# Patient Record
Sex: Male | Born: 1950 | Hispanic: Yes | Marital: Single | Smoking: Former smoker
Health system: Southern US, Community
[De-identification: ages and names within clinical notes are randomized; demographics above are authoritative.]

## PROBLEM LIST (undated history)

## (undated) DIAGNOSIS — J189 Pneumonia, unspecified organism: Secondary | ICD-10-CM

## (undated) DIAGNOSIS — J45909 Unspecified asthma, uncomplicated: Secondary | ICD-10-CM

## (undated) HISTORY — PX: ABOVE KNEE LEG AMPUTATION: SUR20

## (undated) HISTORY — DX: Unspecified asthma, uncomplicated: J45.909

---

## 2019-03-18 ENCOUNTER — Ambulatory Visit: Payer: Self-pay | Attending: Internal Medicine

## 2019-03-18 DIAGNOSIS — Z23 Encounter for immunization: Secondary | ICD-10-CM | POA: Insufficient documentation

## 2019-03-18 NOTE — Progress Notes (Signed)
   Covid-19 Vaccination Clinic  Name:  Jerry Bradley    MRN: [id number] DOB: Oct 31, 1950  03/18/2019  [name] was observed post Covid-19 immunization for 15 minutes without incidence. He was provided with Vaccine Information Sheet and instruction to access the V-Safe system.   [name] was instructed to call 911 with any severe reactions post vaccine: Marland Kitchen Difficulty breathing  . Swelling of your face and throat  . A fast heartbeat  . A bad rash all over your body  . Dizziness and weakness    Immunizations Administered    Name Date Dose VIS Date Route   Pfizer COVID-19 Vaccine 03/18/2019  8:34 AM 0.3 mL 01/06/2019 Intramuscular   Manufacturer: [employer], [employer]   Lot: [id number]   NDC: [address]-1

## 2019-04-11 ENCOUNTER — Ambulatory Visit: Payer: Self-pay | Attending: Internal Medicine

## 2019-04-11 DIAGNOSIS — Z23 Encounter for immunization: Secondary | ICD-10-CM

## 2019-04-11 NOTE — Progress Notes (Signed)
   Covid-19 Vaccination Clinic  Name:  Jestin Burbach    MRN: [id number] DOB: 28-Jul-1950  04/11/2019  Mr. Socorro Ebron was observed post Covid-19 immunization for 15 minutes without incident. He was provided with Vaccine Information Sheet and instruction to access the V-Safe system.   Mr. Krish Bailly was instructed to call 911 with any severe reactions post vaccine: Marland Kitchen Difficulty breathing  . Swelling of face and throat  . A fast heartbeat  . A bad rash all over body  . Dizziness and weakness   Immunizations Administered    Name Date Dose VIS Date Route   Pfizer COVID-19 Vaccine 04/11/2019  9:45 AM 0.3 mL 01/06/2019 Intramuscular   Manufacturer: [employer], [employer]   Lot: [id number]   NDC: [address]-4

## 2020-09-23 ENCOUNTER — Other Ambulatory Visit: Payer: Self-pay

## 2020-09-23 ENCOUNTER — Emergency Department (HOSPITAL_COMMUNITY)
Admission: EM | Admit: 2020-09-23 | Discharge: 2020-09-24 | Disposition: A | Discharge status: Other Acute Inpatient Hospital | Payer: Self-pay | Attending: Emergency Medicine | Admitting: Emergency Medicine

## 2020-09-23 DIAGNOSIS — T24212A Burn of second degree of left thigh, initial encounter: Secondary | ICD-10-CM | POA: Insufficient documentation

## 2020-09-23 DIAGNOSIS — Z20822 Contact with and (suspected) exposure to covid-19: Secondary | ICD-10-CM | POA: Insufficient documentation

## 2020-09-23 DIAGNOSIS — T23121A Burn of first degree of single right finger (nail) except thumb, initial encounter: Secondary | ICD-10-CM | POA: Insufficient documentation

## 2020-09-23 DIAGNOSIS — T300 Burn of unspecified body region, unspecified degree: Secondary | ICD-10-CM

## 2020-09-23 DIAGNOSIS — T23122A Burn of first degree of single left finger (nail) except thumb, initial encounter: Secondary | ICD-10-CM | POA: Insufficient documentation

## 2020-09-23 DIAGNOSIS — Z23 Encounter for immunization: Secondary | ICD-10-CM | POA: Insufficient documentation

## 2020-09-23 DIAGNOSIS — X0821XA Exposure to other furniture fire due to burning cigarette, initial encounter: Secondary | ICD-10-CM | POA: Insufficient documentation
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
## 2020-09-24 ENCOUNTER — Emergency Department (HOSPITAL_COMMUNITY): Payer: Self-pay

## 2020-09-24 ENCOUNTER — Encounter (HOSPITAL_COMMUNITY): Payer: Self-pay | Admitting: Emergency Medicine

## 2020-09-24 LAB — CBC WITH DIFFERENTIAL/PLATELET
Abs Immature Granulocytes: 0.08 10*3/uL — ABNORMAL HIGH (ref 0.00–0.07)
Basophils Absolute: 0.1 10*3/uL (ref 0.0–0.1)
Basophils Relative: 1 %
Eosinophils Absolute: 0.5 10*3/uL (ref 0.0–0.5)
Eosinophils Relative: 7 %
HCT: 39.7 % (ref 39.0–52.0)
Hemoglobin: 13.2 g/dL (ref 13.0–17.0)
Immature Granulocytes: 1 %
Lymphocytes Relative: 14 %
Lymphs Abs: 1.1 10*3/uL (ref 0.7–4.0)
MCH: 30.3 pg (ref 26.0–34.0)
MCHC: 33.2 g/dL (ref 30.0–36.0)
MCV: 91.1 fL (ref 80.0–100.0)
Monocytes Absolute: 0.9 10*3/uL (ref 0.1–1.0)
Monocytes Relative: 12 %
Neutro Abs: 5.1 10*3/uL (ref 1.7–7.7)
Neutrophils Relative %: 65 %
Platelets: 323 10*3/uL (ref 150–400)
RBC: 4.36 MIL/uL (ref 4.22–5.81)
RDW: 12.6 % (ref 11.5–15.5)
WBC: 7.8 10*3/uL (ref 4.0–10.5)
nRBC: 0 % (ref 0.0–0.2)

## 2020-09-24 LAB — POC SARS CORONAVIRUS 2 AG -  ED: SARSCOV2ONAVIRUS 2 AG: NEGATIVE

## 2020-09-24 LAB — BASIC METABOLIC PANEL
Anion gap: 10 (ref 5–15)
BUN: 16 mg/dL (ref 8–23)
CO2: 21 mmol/L — ABNORMAL LOW (ref 22–32)
Calcium: 9.2 mg/dL (ref 8.9–10.3)
Chloride: 105 mmol/L (ref 98–111)
Creatinine, Ser: 1.02 mg/dL (ref 0.61–1.24)
GFR, Estimated: >60 mL/min (ref >60)
Glucose, Bld: 108 mg/dL — ABNORMAL HIGH (ref 70–99)
Potassium: 3.9 mmol/L (ref 3.5–5.1)
Sodium: 136 mmol/L (ref 135–145)

## 2020-09-24 LAB — RESP PANEL BY RT-PCR (FLU A&B, COVID) ARPGX2
Influenza A by PCR: NEGATIVE
Influenza B by PCR: NEGATIVE
SARS Coronavirus 2 by RT PCR: NEGATIVE

## 2020-09-24 MED ORDER — TETANUS-DIPHTH-ACELL PERTUSSIS 5-2.5-18.5 LF-MCG/0.5 IM SUSY
0.5000 mL | PREFILLED_SYRINGE | Freq: Once | INTRAMUSCULAR | Status: AC
  Administered 2020-09-24: 0.5 mL via INTRAMUSCULAR
  Filled 2020-09-24: qty 0.5

## 2020-09-24 MED ORDER — FENTANYL CITRATE PF 50 MCG/ML IJ SOSY
50.0000 ug | PREFILLED_SYRINGE | Freq: Once | INTRAMUSCULAR | Status: AC
  Administered 2020-09-24: 50 ug via INTRAVENOUS
  Filled 2020-09-24: qty 1
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
## 2020-09-24 NOTE — ED Notes (Signed)
Report called to [facility] charge RN for ED

## 2020-09-24 NOTE — ED Notes (Signed)
Fentynaal given to carelink team

## 2020-10-03 ENCOUNTER — Emergency Department (HOSPITAL_COMMUNITY)
Admission: EM | Admit: 2020-10-03 | Discharge: 2020-10-04 | Disposition: A | Discharge status: Home / Self Care | Payer: Self-pay | Attending: Emergency Medicine | Admitting: Emergency Medicine

## 2020-10-03 DIAGNOSIS — T402X1A Poisoning by other opioids, accidental (unintentional), initial encounter: Secondary | ICD-10-CM | POA: Insufficient documentation

## 2020-10-03 DIAGNOSIS — F10120 Alcohol abuse with intoxication, uncomplicated: Secondary | ICD-10-CM | POA: Insufficient documentation

## 2020-10-03 DIAGNOSIS — F10920 Alcohol use, unspecified with intoxication, uncomplicated: Secondary | ICD-10-CM

## 2020-10-03 DIAGNOSIS — T40601A Poisoning by unspecified narcotics, accidental (unintentional), initial encounter: Secondary | ICD-10-CM

## 2020-10-03 DIAGNOSIS — Y906 Blood alcohol level of 120-199 mg/100 ml: Secondary | ICD-10-CM | POA: Insufficient documentation

## 2020-10-03 DIAGNOSIS — F1410 Cocaine abuse, uncomplicated: Secondary | ICD-10-CM | POA: Insufficient documentation
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
## 2020-10-04 LAB — CBC WITH DIFFERENTIAL/PLATELET
Abs Immature Granulocytes: 0.28 10*3/uL — ABNORMAL HIGH (ref 0.00–0.07)
Basophils Absolute: 0.1 10*3/uL (ref 0.0–0.1)
Basophils Relative: 1 %
Eosinophils Absolute: 0.1 10*3/uL (ref 0.0–0.5)
Eosinophils Relative: 1 %
HCT: 42.8 % (ref 39.0–52.0)
Hemoglobin: 13.9 g/dL (ref 13.0–17.0)
Immature Granulocytes: 2 %
Lymphocytes Relative: 6 %
Lymphs Abs: 0.7 10*3/uL (ref 0.7–4.0)
MCH: 29.6 pg (ref 26.0–34.0)
MCHC: 32.5 g/dL (ref 30.0–36.0)
MCV: 91.1 fL (ref 80.0–100.0)
Monocytes Absolute: 0.7 10*3/uL (ref 0.1–1.0)
Monocytes Relative: 6 %
Neutro Abs: 9.9 10*3/uL — ABNORMAL HIGH (ref 1.7–7.7)
Neutrophils Relative %: 84 %
Platelets: 424 10*3/uL — ABNORMAL HIGH (ref 150–400)
RBC: 4.7 MIL/uL (ref 4.22–5.81)
RDW: 12.3 % (ref 11.5–15.5)
WBC: 11.8 10*3/uL — ABNORMAL HIGH (ref 4.0–10.5)
nRBC: 0 % (ref 0.0–0.2)

## 2020-10-04 LAB — COMPREHENSIVE METABOLIC PANEL
ALT: 40 U/L (ref 0–44)
AST: 47 U/L — ABNORMAL HIGH (ref 15–41)
Albumin: 3.5 g/dL (ref 3.5–5.0)
Alkaline Phosphatase: 54 U/L (ref 38–126)
Anion gap: 12 (ref 5–15)
BUN: 13 mg/dL (ref 8–23)
CO2: 18 mmol/L — ABNORMAL LOW (ref 22–32)
Calcium: 8.8 mg/dL — ABNORMAL LOW (ref 8.9–10.3)
Chloride: 109 mmol/L (ref 98–111)
Creatinine, Ser: 1.06 mg/dL (ref 0.61–1.24)
GFR, Estimated: >60 mL/min (ref >60)
Glucose, Bld: 85 mg/dL (ref 70–99)
Potassium: 3.8 mmol/L (ref 3.5–5.1)
Sodium: 139 mmol/L (ref 135–145)
Total Bilirubin: 0.3 mg/dL (ref 0.3–1.2)
Total Protein: 7.5 g/dL (ref 6.5–8.1)

## 2020-10-04 LAB — RAPID URINE DRUG SCREEN, HOSP PERFORMED
Amphetamines: NOT DETECTED
Barbiturates: NOT DETECTED
Benzodiazepines: NOT DETECTED
Cocaine: POSITIVE — AB
Opiates: NOT DETECTED
Tetrahydrocannabinol: NOT DETECTED

## 2020-10-04 LAB — ETHANOL: Alcohol, Ethyl (B): 120 mg/dL — ABNORMAL HIGH (ref <10)

## 2020-12-09 ENCOUNTER — Emergency Department (HOSPITAL_COMMUNITY)
Admission: EM | Admit: 2020-12-09 | Discharge: 2020-12-09 | Disposition: A | Discharge status: Home / Self Care | Payer: Self-pay | Attending: Emergency Medicine | Admitting: Emergency Medicine

## 2020-12-09 ENCOUNTER — Emergency Department (HOSPITAL_COMMUNITY): Payer: Self-pay

## 2020-12-09 ENCOUNTER — Other Ambulatory Visit: Payer: Self-pay

## 2020-12-09 ENCOUNTER — Ambulatory Visit: Payer: Self-pay | Attending: Critical Care Medicine | Admitting: Critical Care Medicine

## 2020-12-09 ENCOUNTER — Encounter: Payer: Self-pay | Admitting: Critical Care Medicine

## 2020-12-09 ENCOUNTER — Other Ambulatory Visit: Payer: Self-pay | Admitting: Critical Care Medicine

## 2020-12-09 VITALS — BP 209/101 | HR 74 | Resp 16 | Wt 140.0 lb

## 2020-12-09 DIAGNOSIS — J441 Chronic obstructive pulmonary disease with (acute) exacerbation: Secondary | ICD-10-CM

## 2020-12-09 DIAGNOSIS — Z79899 Other long term (current) drug therapy: Secondary | ICD-10-CM | POA: Insufficient documentation

## 2020-12-09 DIAGNOSIS — Z87828 Personal history of other (healed) physical injury and trauma: Secondary | ICD-10-CM | POA: Insufficient documentation

## 2020-12-09 DIAGNOSIS — Z20822 Contact with and (suspected) exposure to covid-19: Secondary | ICD-10-CM | POA: Insufficient documentation

## 2020-12-09 DIAGNOSIS — Z139 Encounter for screening, unspecified: Secondary | ICD-10-CM

## 2020-12-09 DIAGNOSIS — Z1211 Encounter for screening for malignant neoplasm of colon: Secondary | ICD-10-CM

## 2020-12-09 DIAGNOSIS — I10 Essential (primary) hypertension: Secondary | ICD-10-CM

## 2020-12-09 DIAGNOSIS — Z87891 Personal history of nicotine dependence: Secondary | ICD-10-CM | POA: Insufficient documentation

## 2020-12-09 DIAGNOSIS — J45909 Unspecified asthma, uncomplicated: Secondary | ICD-10-CM | POA: Insufficient documentation

## 2020-12-09 DIAGNOSIS — Z1159 Encounter for screening for other viral diseases: Secondary | ICD-10-CM

## 2020-12-09 DIAGNOSIS — Z7951 Long term (current) use of inhaled steroids: Secondary | ICD-10-CM | POA: Insufficient documentation

## 2020-12-09 DIAGNOSIS — H9190 Unspecified hearing loss, unspecified ear: Secondary | ICD-10-CM | POA: Insufficient documentation

## 2020-12-09 DIAGNOSIS — R0603 Acute respiratory distress: Secondary | ICD-10-CM

## 2020-12-09 DIAGNOSIS — R071 Chest pain on breathing: Secondary | ICD-10-CM

## 2020-12-09 DIAGNOSIS — Z89612 Acquired absence of left leg above knee: Secondary | ICD-10-CM | POA: Insufficient documentation

## 2020-12-09 HISTORY — DX: Essential (primary) hypertension: I10

## 2020-12-09 LAB — CBC WITH DIFFERENTIAL/PLATELET
Abs Immature Granulocytes: 0.04 10*3/uL (ref 0.00–0.07)
Basophils Absolute: 0.2 10*3/uL — ABNORMAL HIGH (ref 0.0–0.1)
Basophils Relative: 2 %
Eosinophils Absolute: 1 10*3/uL — ABNORMAL HIGH (ref 0.0–0.5)
Eosinophils Relative: 11 %
HCT: 44.1 % (ref 39.0–52.0)
Hemoglobin: 14.4 g/dL (ref 13.0–17.0)
Immature Granulocytes: 1 %
Lymphocytes Relative: 23 %
Lymphs Abs: 1.9 10*3/uL (ref 0.7–4.0)
MCH: 29.2 pg (ref 26.0–34.0)
MCHC: 32.7 g/dL (ref 30.0–36.0)
MCV: 89.5 fL (ref 80.0–100.0)
Monocytes Absolute: 0.8 10*3/uL (ref 0.1–1.0)
Monocytes Relative: 9 %
Neutro Abs: 4.7 10*3/uL (ref 1.7–7.7)
Neutrophils Relative %: 54 %
Platelets: 366 10*3/uL (ref 150–400)
RBC: 4.93 MIL/uL (ref 4.22–5.81)
RDW: 12.3 % (ref 11.5–15.5)
WBC: 8.6 10*3/uL (ref 4.0–10.5)
nRBC: 0 % (ref 0.0–0.2)

## 2020-12-09 LAB — RESP PANEL BY RT-PCR (FLU A&B, COVID) ARPGX2
Influenza A by PCR: NEGATIVE
Influenza B by PCR: NEGATIVE
SARS Coronavirus 2 by RT PCR: NEGATIVE

## 2020-12-09 LAB — BASIC METABOLIC PANEL
Anion gap: 7 (ref 5–15)
BUN: 17 mg/dL (ref 8–23)
CO2: 23 mmol/L (ref 22–32)
Calcium: 8.8 mg/dL — ABNORMAL LOW (ref 8.9–10.3)
Chloride: 106 mmol/L (ref 98–111)
Creatinine, Ser: 0.83 mg/dL (ref 0.61–1.24)
GFR, Estimated: >60 mL/min (ref >60)
Glucose, Bld: 102 mg/dL — ABNORMAL HIGH (ref 70–99)
Potassium: 3.7 mmol/L (ref 3.5–5.1)
Sodium: 136 mmol/L (ref 135–145)

## 2020-12-09 LAB — BRAIN NATRIURETIC PEPTIDE: B Natriuretic Peptide: 15.8 pg/mL (ref 0.0–100.0)

## 2020-12-09 MED ORDER — AMLODIPINE BESYLATE 5 MG PO TABS: 5.0000 mg | ORAL_TABLET | Freq: Every day | ORAL | 2 refills | Status: DC

## 2020-12-09 MED ORDER — METHYLPREDNISOLONE SODIUM SUCC 125 MG IJ SOLR
125.0000 mg | Freq: Once | INTRAMUSCULAR | Status: AC
  Administered 2020-12-09: 125 mg via INTRAVENOUS
  Filled 2020-12-09: qty 2

## 2020-12-09 MED ORDER — PREDNISONE 10 MG PO TABS: 40.0000 mg | ORAL_TABLET | Freq: Every day | ORAL | 0 refills | Status: DC

## 2020-12-09 MED ORDER — ALBUTEROL SULFATE HFA 108 (90 BASE) MCG/ACT IN AERS: 4.0000 | INHALATION_SPRAY | Freq: Once | RESPIRATORY_TRACT | Status: DC

## 2020-12-09 MED ORDER — ALBUTEROL SULFATE HFA 108 (90 BASE) MCG/ACT IN AERS: 2.0000 | INHALATION_SPRAY | RESPIRATORY_TRACT | 0 refills | Status: DC | PRN

## 2020-12-09 MED ORDER — CLONIDINE HCL 0.1 MG PO TABS: 0.1000 mg | ORAL_TABLET | Freq: Once | ORAL | Status: DC

## 2020-12-09 MED ORDER — IPRATROPIUM-ALBUTEROL 0.5-2.5 (3) MG/3ML IN SOLN
3.0000 mL | Freq: Once | RESPIRATORY_TRACT | Status: AC
  Administered 2020-12-09: 3 mL via RESPIRATORY_TRACT
  Filled 2020-12-09: qty 3

## 2020-12-09 NOTE — ED Notes (Signed)
This RN offer pt a Engineer, structural for the discussion of d/c paperwork. Pt declined. E-signature pad unavailable at time of pt discharge. This RN discussed discharge materials with pt and answered all pt questions. Pt stated understanding of discharge material.

## 2020-12-09 NOTE — ED Notes (Signed)
Per Dr. Renaye Rakers, pt OK to eat/drink at this time.
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
## 2020-12-09 NOTE — Assessment & Plan Note (Signed)
Respiratory distress suspect COPD exacerbation not treated prior to this was using over-the-counter Primatene Mist  4 puffs of albuterol given in the office and oxygen applied  Care handed over to emergency medical technicians with EMS

## 2020-12-09 NOTE — ED Triage Notes (Signed)
Pt arrives via EMS for SOB and hypertension. Pt was at the [facility] health and wellness clinic for SOB. They gave 4 puffs of albuterol. States his blood pressures was 200s/100s. COVID-19 Vaccine Information can be found .1mg  clonidine PTA. EMS gave 5 mg albuterol in route. Pt reports SOB is now resolved.

## 2020-12-09 NOTE — ED Provider Notes (Signed)
[facility] EMERGENCY DEPARTMENT Provider Note   CSN: [id number] Arrival date & time: 12/09/20  1734     History Chief Complaint  Patient presents with   Shortness of Breath   Hypertension    Jerry Bradley is a 70 y.o. male with history of chronic hypertension, smoking (quit recently), presenting as a transfer from the wellness clinic with concern for respiratory distress and hypertension.  The patient reports he has felt short of breath the past several days, worse at night when he is in bed.  He uses an albuterol pump every 4-6 hours, 2 puffs as needed.  He does not have a nebulizer machine.  He has not actively smoking.  He went to the wellness clinic for an office appointment today, where he was noted to have very high blood pressure and complaining of shortness of breath.  He was given a dose of clonidine and transferred to the emergency department.  EMS gave the patient 1 rounds of nebulizers in route to the hospital, but did not give any other medications including steroids.  The patient says he feels better since arriving in the hospital.  He denies any chest pain or pressure today.  A Spanish interpreter was used for the entirety of my history and physical exam, [place]  HPI     Past Medical History:  Diagnosis Date   Asthma    HTN (hypertension), malignant 12/09/2020    Patient Active Problem List   Diagnosis Date Noted   Respiratory distress 12/09/2020   HTN (hypertension), malignant 12/09/2020    Past Surgical History:  Procedure Laterality Date   ABOVE KNEE LEG AMPUTATION Right        No family history on file.  Social History   Tobacco Use   Smoking status: Former    Packs/day: 1.00    Years: 45.00    Pack years: 45.00    Types: Cigarettes    Quit date: 11/12/2020    Years since quitting: 0.0   Smokeless tobacco: Never  Substance Use Topics   Alcohol use: Yes    Alcohol/week: 14.0 standard drinks    Types: 14 Cans of  beer per week   Drug use: Not Currently    Types: Cocaine    Comment: one time use , 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    Home Medications Prior to Admission medications   Medication Sig Start Date End Date Taking? Authorizing Provider  albuterol (VENTOLIN HFA) 108 (90 Base) MCG/ACT inhaler Inhale 2 puffs into the lungs every 4 (four) hours as needed for wheezing or shortness of breath. 12/09/20  Yes Wyvonnia Dusky, MD  amLODipine (NORVASC) 10 MG tablet Take 1 tablet (10 mg total) by mouth daily for 30 doses. 12/10/20 01/09/21  Elsie Stain, MD  budesonide-formoterol (SYMBICORT) 160-4.5 MCG/ACT inhaler Inhale 2 puffs into the lungs 2 (two) times daily. 12/10/20 12/10/21  Elsie Stain, MD  predniSONE (DELTASONE) 10 MG tablet Take 4 tablets (40 mg total) by mouth daily with breakfast for 5 days. 12/10/20 12/15/20  Elsie Stain, MD    Allergies    Patient has no known allergies.  Review of Systems   Review of Systems  Constitutional:  Negative for chills and fever.  HENT:  Negative for ear pain and sore throat.   Eyes:  Negative for pain and visual disturbance.  Respiratory:  Positive for shortness of breath. Negative for cough.   Cardiovascular:  Negative for chest pain and palpitations.  Gastrointestinal:  Negative  for abdominal pain and vomiting.  Genitourinary:  Negative for dysuria and hematuria.  Musculoskeletal:  Negative for arthralgias and back pain.  Skin:  Negative for color change and rash.  Neurological:  Negative for syncope and light-headedness.  All other systems reviewed and are negative.  Physical Exam Updated Vital Signs BP 138/76 (BP Location: Right Arm)   Pulse 75   Temp 98.1 F (36.7 C) (Oral)   Resp 19   Ht 5' 8.11" (1.73 m)   Wt 58 kg   SpO2 94%   BMI 19.38 kg/m   Physical Exam Constitutional:      General: He is not in acute distress. HENT:     Head: Normocephalic and atraumatic.  Eyes:     Conjunctiva/sclera: Conjunctivae normal.      Pupils: Pupils are equal, round, and reactive to light.  Cardiovascular:     Rate and Rhythm: Normal rate and regular rhythm.  Pulmonary:     Effort: Pulmonary effort is normal. No respiratory distress.     Breath sounds: Wheezing present.  Abdominal:     General: There is no distension.     Tenderness: There is no abdominal tenderness.  Skin:    General: Skin is warm and dry.  Neurological:     General: No focal deficit present.     Mental Status: He is alert. Mental status is at baseline.  Psychiatric:        Mood and Affect: Mood normal.        Behavior: Behavior normal.    ED Results / Procedures / Treatments   Labs (all labs ordered are listed, but only abnormal results are displayed) Labs Reviewed  BASIC METABOLIC PANEL - Abnormal; Notable for the following components:      Result Value   Glucose, Bld 102 (*)    Calcium 8.8 (*)    All other components within normal limits  CBC WITH DIFFERENTIAL/PLATELET - Abnormal; Notable for the following components:   Eosinophils Absolute 1.0 (*)    Basophils Absolute 0.2 (*)    All other components within normal limits  RESP PANEL BY RT-PCR (FLU A&B, COVID) ARPGX2  BRAIN NATRIURETIC PEPTIDE    EKG None  Radiology DG Chest Port 1 View  Result Date: 12/09/2020 CLINICAL DATA:  Acute shortness of breath. EXAM: PORTABLE CHEST 1 VIEW COMPARISON:  None. FINDINGS: The cardiomediastinal silhouette is unremarkable. Mild peribronchial thickening of uncertain chronicity noted. There is no evidence of focal airspace disease, pulmonary edema, suspicious pulmonary nodule/mass, pleural effusion, or pneumothorax. No acute bony abnormalities are identified. IMPRESSION: Mild peribronchial thickening of uncertain chronicity. No evidence of focal pneumonia. Electronically Signed   By: Harmon Pier M.D.   On: 12/09/2020 18:06    Procedures Procedures   Medications Ordered in ED Medications  ipratropium-albuterol (DUONEB) 0.5-2.5 (3) MG/3ML  nebulizer solution 3 mL (3 mLs Nebulization Given 12/09/20 1807)  methylPREDNISolone sodium succinate (SOLU-MEDROL) 125 mg/2 mL injection 125 mg (125 mg Intravenous Given 12/09/20 1806)    ED Course  I have reviewed the triage vital signs and the nursing notes.  Pertinent labs & imaging results that were available during my care of the patient were reviewed by me and considered in my medical decision making (see chart for details).  COPD exacerbation No hypoxia Wheezing improved with duonebs, IV steroids given Will prescribe prednisone, start on amlodipine for untreated HTN.  BP has been only mildly elevated here.  Doubt ACS, PE, CHF, PNA, sepsis, or PTX clinically Spanish translator used  Final Clinical Impression(s) / ED Diagnoses Final diagnoses:  COPD exacerbation ([place])  Hypertension, unspecified type    Rx / DC Orders ED Discharge Orders          Ordered    predniSONE (DELTASONE) 10 MG tablet  Daily with breakfast,   Status:  Discontinued        12/09/20 1957    albuterol (VENTOLIN HFA) 108 (90 Base) MCG/ACT inhaler  Every 4 hours PRN        12/09/20 1957    amLODipine (NORVASC) 5 MG tablet  Daily,   Status:  Discontinued        12/09/20 2025             Wyvonnia Dusky, MD 12/10/20 1304

## 2020-12-09 NOTE — Assessment & Plan Note (Signed)
Malignant hypertension not well controlled  Failed to respond to 1 dose of 0.1 mg clonidine  Emergency transport to ED

## 2020-12-09 NOTE — Patient Instructions (Signed)
You were given a dose of blood pressure medicine and an inhaler  Your EKG was normal  You are still not breathing well and your blood pressure is too high we have called EMS to take you to the hospital for further evaluations and treatments  Le dieron una dosis de medicamento para la presin arterial y Advertising account executive  Su electrocardiograma fue normal  Todava no est respirando bien y su presin arterial es demasiado alta. Llamamos a EMS para que lo lleve al hospital para realizar ms evaluaciones y tratamientos.

## 2020-12-10 ENCOUNTER — Telehealth: Payer: Self-pay | Admitting: Critical Care Medicine

## 2020-12-10 ENCOUNTER — Other Ambulatory Visit: Payer: Self-pay

## 2020-12-10 MED ORDER — AMLODIPINE BESYLATE 10 MG PO TABS
10.0000 mg | ORAL_TABLET | Freq: Every day | ORAL | 2 refills | Status: DC
  Filled 2020-12-10: qty 30, 30d supply, fill #0
  Filled 2021-01-22: qty 30, 30d supply, fill #1

## 2020-12-10 MED ORDER — ALBUTEROL SULFATE HFA 108 (90 BASE) MCG/ACT IN AERS
INHALATION_SPRAY | RESPIRATORY_TRACT | 0 refills | Status: DC
  Filled 2020-12-10: qty 8.5, 16d supply, fill #0

## 2020-12-10 MED ORDER — BUDESONIDE-FORMOTEROL FUMARATE 160-4.5 MCG/ACT IN AERO
2.0000 | INHALATION_SPRAY | Freq: Two times a day (BID) | RESPIRATORY_TRACT | 12 refills | Status: DC
  Filled 2020-12-10: qty 10.2, 30d supply, fill #0
  Filled 2020-12-30: qty 10.2, 30d supply, fill #1
  Filled 2021-01-22: qty 10.2, 30d supply, fill #2
  Filled 2021-02-13: qty 10.2, 30d supply, fill #0

## 2020-12-10 MED ORDER — PREDNISONE 10 MG PO TABS
40.0000 mg | ORAL_TABLET | Freq: Every day | ORAL | 0 refills | Status: AC
  Filled 2020-12-10: qty 20, 5d supply, fill #0

## 2020-12-10 NOTE — Telephone Encounter (Signed)
Pt sent to ED from clinic yesterday PM.  ED treated and released the patient.  Call pt and tell him I sent rx for his Blood pressure medication that the ED ordered and also the prednisone was sent as well to our pharmacy.  Also I sent a new inhaler for him to use   He needs OV with me on Monday for post ED f/u.

## 2020-12-10 NOTE — Telephone Encounter (Signed)
Called patient with interpreter services. Patient did not answer and a vm was left to call [phone] to schedule post ED f/u

## 2020-12-11 ENCOUNTER — Telehealth: Payer: Self-pay | Admitting: Critical Care Medicine

## 2020-12-11 NOTE — Telephone Encounter (Signed)
Still not able to connect with the patient, cell goes to vm.  Then tried to call son Jillyn Hidden, again goes to VM and left another msg there  Assisted with spanish interp So[place] [phone]

## 2020-12-12 LAB — CBC WITH DIFFERENTIAL/PLATELET
Basophils Absolute: 0.2 10*3/uL (ref 0.0–0.2)
Basos: 2 %
EOS (ABSOLUTE): 0.9 10*3/uL — ABNORMAL HIGH (ref 0.0–0.4)
Eos: 12 %
Hematocrit: 44.4 % (ref 37.5–51.0)
Hemoglobin: 15.4 g/dL (ref 13.0–17.7)
Immature Grans (Abs): 0.1 10*3/uL (ref 0.0–0.1)
Immature Granulocytes: 1 %
Lymphocytes Absolute: 1.4 10*3/uL (ref 0.7–3.1)
Lymphs: 17 %
MCH: 29.3 pg (ref 26.6–33.0)
MCHC: 34.7 g/dL (ref 31.5–35.7)
MCV: 85 fL (ref 79–97)
Monocytes Absolute: 0.7 10*3/uL (ref 0.1–0.9)
Monocytes: 8 %
Neutrophils Absolute: 4.7 10*3/uL (ref 1.4–7.0)
Neutrophils: 60 %
Platelets: 399 10*3/uL (ref 150–450)
RBC: 5.25 x10E6/uL (ref 4.14–5.80)
RDW: 11.7 % (ref 11.6–15.4)
WBC: 7.9 10*3/uL (ref 3.4–10.8)

## 2020-12-12 LAB — COMPREHENSIVE METABOLIC PANEL
ALT: 38 IU/L (ref 0–44)
AST: 40 IU/L (ref 0–40)
Albumin/Globulin Ratio: 1.4 (ref 1.2–2.2)
Albumin: 4.7 g/dL (ref 3.8–4.8)
Alkaline Phosphatase: 70 IU/L (ref 44–121)
BUN/Creatinine Ratio: 20 (ref 10–24)
BUN: 17 mg/dL (ref 8–27)
Bilirubin Total: 0.3 mg/dL (ref 0.0–1.2)
CO2: 21 mmol/L (ref 20–29)
Calcium: 9.4 mg/dL (ref 8.6–10.2)
Chloride: 103 mmol/L (ref 96–106)
Creatinine, Ser: 0.83 mg/dL (ref 0.76–1.27)
Globulin, Total: 3.3 g/dL (ref 1.5–4.5)
Glucose: 81 mg/dL (ref 70–99)
Potassium: 4.4 mmol/L (ref 3.5–5.2)
Sodium: 138 mmol/L (ref 134–144)
Total Protein: 8 g/dL (ref 6.0–8.5)
eGFR: 96 mL/min/{1.73_m2} (ref >59)

## 2020-12-12 LAB — LIPID PANEL
Chol/HDL Ratio: 3.9 ratio (ref 0.0–5.0)
Cholesterol, Total: 176 mg/dL (ref 100–199)
HDL: 45 mg/dL (ref >39)
LDL Chol Calc (NIH): 97 mg/dL (ref 0–99)
Triglycerides: 198 mg/dL — ABNORMAL HIGH (ref 0–149)
VLDL Cholesterol Cal: 34 mg/dL (ref 5–40)

## 2020-12-12 LAB — HCV RT-PCR, QUANT (NON-GRAPH)
HCV log10: 5.025 log10 IU/mL
Hepatitis C Quantitation: 106000 IU/mL

## 2020-12-12 LAB — TROPONIN T: Troponin T (Highly Sensitive): <6 ng/L (ref 0–22)

## 2020-12-12 LAB — HCV AB W REFLEX TO QUANT PCR: HCV Ab: >11 s/co ratio — ABNORMAL HIGH (ref 0.0–0.9)

## 2020-12-12 LAB — HEMOGLOBIN A1C
Est. average glucose Bld gHb Est-mCnc: 114 mg/dL
Hgb A1c MFr Bld: 5.6 % (ref 4.8–5.6)

## 2020-12-30 ENCOUNTER — Other Ambulatory Visit: Payer: Self-pay | Admitting: Critical Care Medicine

## 2020-12-30 ENCOUNTER — Other Ambulatory Visit: Payer: Self-pay

## 2020-12-30 NOTE — Telephone Encounter (Signed)
Requested medications are due for refill today.  yes  Requested medications are on the active medications list.  yes  Last refill. 12/09/2020  Future visit scheduled.   no  Notes to clinic.  Rx was signed by Dr. Renaye Rakers. Pt has 2 prescriptions for this medication.

## 2020-12-31 ENCOUNTER — Other Ambulatory Visit: Payer: Self-pay

## 2020-12-31 MED ORDER — ALBUTEROL SULFATE HFA 108 (90 BASE) MCG/ACT IN AERS
INHALATION_SPRAY | RESPIRATORY_TRACT | 2 refills | Status: DC
  Filled 2020-12-31 – 2021-02-13 (×3): qty 8.5, 16d supply, fill #0

## 2021-01-07 ENCOUNTER — Other Ambulatory Visit: Payer: Self-pay

## 2021-01-22 ENCOUNTER — Other Ambulatory Visit: Payer: Self-pay

## 2021-02-13 ENCOUNTER — Other Ambulatory Visit: Payer: Self-pay

## 2021-03-05 ENCOUNTER — Encounter: Payer: Self-pay | Admitting: Critical Care Medicine

## 2021-03-05 ENCOUNTER — Ambulatory Visit: Payer: Self-pay | Attending: Critical Care Medicine | Admitting: Critical Care Medicine

## 2021-03-05 ENCOUNTER — Other Ambulatory Visit: Payer: Self-pay

## 2021-03-05 VITALS — BP 135/70 | HR 60 | Resp 16 | Wt 149.6 lb

## 2021-03-05 DIAGNOSIS — Z1211 Encounter for screening for malignant neoplasm of colon: Secondary | ICD-10-CM

## 2021-03-05 DIAGNOSIS — I10 Essential (primary) hypertension: Secondary | ICD-10-CM

## 2021-03-05 DIAGNOSIS — Z23 Encounter for immunization: Secondary | ICD-10-CM

## 2021-03-05 DIAGNOSIS — Z139 Encounter for screening, unspecified: Secondary | ICD-10-CM

## 2021-03-05 DIAGNOSIS — Z89611 Acquired absence of right leg above knee: Secondary | ICD-10-CM

## 2021-03-05 DIAGNOSIS — B182 Chronic viral hepatitis C: Secondary | ICD-10-CM

## 2021-03-05 DIAGNOSIS — Z114 Encounter for screening for human immunodeficiency virus [HIV]: Secondary | ICD-10-CM

## 2021-03-05 DIAGNOSIS — J4540 Moderate persistent asthma, uncomplicated: Secondary | ICD-10-CM

## 2021-03-05 MED ORDER — OLOPATADINE HCL 0.1 % OP SOLN
1.0000 [drp] | Freq: Two times a day (BID) | OPHTHALMIC | 12 refills | Status: DC
  Filled 2021-03-05: qty 5, 50d supply, fill #0

## 2021-03-05 MED ORDER — ALBUTEROL SULFATE HFA 108 (90 BASE) MCG/ACT IN AERS
INHALATION_SPRAY | RESPIRATORY_TRACT | 2 refills | Status: DC
  Filled 2021-03-05: qty 8.5, 16d supply, fill #0
  Filled 2021-03-25: qty 8.5, 16d supply, fill #1
  Filled 2021-04-07: qty 8.5, 16d supply, fill #2

## 2021-03-05 MED ORDER — AMLODIPINE BESYLATE 10 MG PO TABS
10.0000 mg | ORAL_TABLET | Freq: Every day | ORAL | 2 refills | Status: DC
  Filled 2021-03-05: qty 30, 30d supply, fill #0
  Filled 2021-04-07: qty 30, 30d supply, fill #1
  Filled 2021-05-12: qty 30, 30d supply, fill #2

## 2021-03-05 MED ORDER — BUDESONIDE-FORMOTEROL FUMARATE 160-4.5 MCG/ACT IN AERO
2.0000 | INHALATION_SPRAY | Freq: Two times a day (BID) | RESPIRATORY_TRACT | 12 refills | Status: DC
Start: 2021-03-05 — End: 2021-04-02
  Filled 2021-03-05 – 2021-03-07 (×2): qty 10.2, 30d supply, fill #0
  Filled 2021-04-01: qty 10.2, 30d supply, fill #1

## 2021-03-05 NOTE — Patient Instructions (Signed)
Labs today including complete metabolic panel blood counts immunoglobulin a level and colon cancer screening kit and cholesterol  Please process the colon cancer screening kit bring it back to the lab  Pneumonia vaccine flu vaccine were given  Focus on reducing your beer intake to only 1 or 2 beers a week  Referral to hepatitis C clinic was made your hepatitis C antibody was positive  Your medications were refilled no change in doses we instructed as to proper use of your inhalers  Begin eyedrops 2 drops each eye twice daily for eye itching  Go to the [facility] to have an eye exam this will cost $85  Pick up the financial assistance application on the way out so he can obtain an orange card you will have to make an appointment with Clifton James a financial counselor from there if you receive that I can refer you to a dentist you need to go to a dentist because you have very severe dental disease that is affecting your breathing and your blood pressure  Los laboratorios de hoy incluyen un panel metablico completo de hemogramas, nivel de inmunoglobulina a y kit de deteccin de cncer de colon y colesterol.  Procese el kit de deteccin de cncer de colon y trigalo al laboratorio.  Se administr la vacuna contra la gripe contra la neumona  Concntrese en reducir su consumo de cerveza a solo 1 o 2 cervezas por semana  Se realiz una derivacin a la clnica de hepatitis C. Su anticuerpo contra la hepatitis C fue positivo.  Sus medicamentos fueron reabastecidos. No hubo cambios en las dosis. Le indicamos el uso adecuado de sus inhaladores.  Comience con gotas para los ojos 2 gotas en cada ojo dos veces al da para la picazn en los ojos  Vaya a [facility] [place] para hacerse un examen de la vista, esto costar $ 85  recoja la solicitud de asistencia financiera a la salida para que pueda obtener una tarjeta naranja tendr que hacer una cita con carlos un asesor financiero de  all si recibe que puedo referirlo a un dentista necesita ir a un dentista porque tiene Print production planner muy grave que est afectando su respiracin y su presin arterial

## 2021-03-05 NOTE — Progress Notes (Signed)
Pt states that for 3-4 months his eye have been given a lot of discomfort, states it feels like sand in his eye and also has some itching.

## 2021-03-05 NOTE — Assessment & Plan Note (Signed)
Hypertension markedly improved on amlodipine 5 mg daily maintain same dosing

## 2021-03-05 NOTE — Assessment & Plan Note (Signed)
Patient has really more of an asthma syndrome we will maintain inhaled medications as prescribed

## 2021-03-05 NOTE — Progress Notes (Signed)
Established Patient Office Visit  Subjective:  Patient ID: Jerry Bradley, male    DOB: 1950-08-24  Age: 71 y.o. MRN: [id number]  CC:  Chief Complaint  Patient presents with   Eye Problem   Hypertension    HPI [name] presents for primary care follow-up.  At the last visit the patient was admitted to the emergency room for prolonged treatment of hypertension and COPD exacerbation.  This visit was assisted by video interpreter Dawson Bills [phone]  Patient agrees to receive Prevnar 20 vaccine and flu vaccine at this visit.  Patient states he is no longer smoking tobacco products since October 2022.  He has occasional wheezing in the morning.  He drinks 1-2 beers a day.  The patient complains of some dental pain.  He also complains of eye irritation and matting of the eyes when he wakes up in the morning.  Note at the last visit we checked him for hepatitis C and it was positive and reflexed to a viral load of 100,000.  Blood pressure on arrival was 160/83 and recheck it was 135/70 and he contains amlodipine daily.  Patient has no other complaints he is using his inhalers on a regular basis states his breathing is better Past Medical History:  Diagnosis Date   Asthma    HTN (hypertension), malignant 12/09/2020    Past Surgical History:  Procedure Laterality Date   ABOVE KNEE LEG AMPUTATION Right     History reviewed. No pertinent family history.  Social History   Socioeconomic History   Marital status: Single    Spouse name: Not on file   Number of children: Not on file   Years of education: Not on file   Highest education level: Not on file  Occupational History   Not on file  Tobacco Use   Smoking status: Former    Packs/day: 1.00    Years: 45.00    Pack years: 45.00    Types: Cigarettes    Quit date: 11/12/2020    Years since quitting: 0.3   Smokeless tobacco: Never  Vaping Use   Vaping Use: Never used  Substance and Sexual Activity   Alcohol use:  Yes    Alcohol/week: 14.0 standard drinks    Types: 14 Cans of beer per week   Drug use: Not Currently    Types: Cocaine    Comment: one time use , 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   Sexual activity: Yes    Birth control/protection: Condom  Other Topics Concern   Not on file  Social History Narrative   Not on file   Social Determinants of Health   Financial Resource Strain: Not on file  Food Insecurity: Not on file  Transportation Needs: Not on file  Physical Activity: Not on file  Stress: Not on file  Social Connections: Not on file  Intimate Partner Violence: Not on file    Outpatient Medications Prior to Visit  Medication Sig Dispense Refill   albuterol (VENTOLIN HFA) 108 (90 Base) MCG/ACT inhaler Inhale 2 puffs into the lungs every 4 hours as needed for wheezing or shortness of breath 8.5 g 2   budesonide-formoterol (SYMBICORT) 160-4.5 MCG/ACT inhaler Inhale 2 puffs into the lungs 2 (two) times daily. 10.2 g 12   amLODipine (NORVASC) 10 MG tablet Take 1 tablet (10 mg total) by mouth daily for 30 doses. 30 tablet 2   No facility-administered medications prior to visit.    No Known Allergies  ROS Review of Systems  Constitutional:  Negative.  Negative for fatigue and fever.  HENT: Negative.  Negative for ear discharge, ear pain, hearing loss, mouth sores, nosebleeds, postnasal drip, rhinorrhea, sinus pressure, sinus pain, sneezing, sore throat, tinnitus, trouble swallowing and voice change.   Eyes:  Positive for discharge and itching.       Dry eyes  Respiratory:  Positive for cough and wheezing. Negative for apnea, choking, chest tightness, shortness of breath and stridor.   Cardiovascular: Negative.  Negative for chest pain, palpitations and leg swelling.  Gastrointestinal: Negative.  Negative for abdominal distention, abdominal pain, nausea and vomiting.  Endocrine: Negative.   Genitourinary: Negative.   Musculoskeletal: Negative.  Negative for arthralgias and myalgias.  Skin:  Negative.  Negative for rash.  Allergic/Immunologic: Negative.  Negative for environmental allergies and food allergies.  Neurological: Negative.  Negative for dizziness, seizures, syncope, weakness, light-headedness, numbness and headaches.  Hematological: Negative.  Negative for adenopathy. Does not bruise/bleed easily.  Psychiatric/Behavioral: Negative.  Negative for agitation, dysphoric mood, hallucinations, self-injury, sleep disturbance and suicidal ideas. The patient is not nervous/anxious and is not hyperactive.      Objective:    Physical Exam Vitals reviewed.  Constitutional:      Appearance: Normal appearance. He is well-developed. He is not diaphoretic.  HENT:     Head: Normocephalic and atraumatic.     Right Ear: Tympanic membrane normal.     Left Ear: Tympanic membrane normal.     Nose: Nose normal. No nasal deformity, septal deviation, mucosal edema or rhinorrhea.     Right Sinus: No maxillary sinus tenderness or frontal sinus tenderness.     Left Sinus: No maxillary sinus tenderness or frontal sinus tenderness.     Mouth/Throat:     Mouth: Mucous membranes are moist.     Pharynx: Oropharynx is clear. No oropharyngeal exudate.  Eyes:     General: No scleral icterus.    Conjunctiva/sclera: Conjunctivae normal.     Pupils: Pupils are equal, round, and reactive to light.  Neck:     Thyroid: No thyromegaly.     Vascular: No carotid bruit or JVD.     Trachea: Trachea normal. No tracheal tenderness or tracheal deviation.  Cardiovascular:     Rate and Rhythm: Normal rate and regular rhythm.     Chest Wall: PMI is not displaced.     Pulses: Normal pulses. No decreased pulses.     Heart sounds: Normal heart sounds, S1 normal and S2 normal. Heart sounds not distant. No murmur heard. No systolic murmur is present.  No diastolic murmur is present.    No friction rub. No gallop. No S3 or S4 sounds.  Pulmonary:     Effort: Pulmonary effort is normal. No tachypnea, accessory  muscle usage or respiratory distress.     Breath sounds: Normal breath sounds. No stridor. No decreased breath sounds, wheezing, rhonchi or rales.     Comments: Distant breath sounds Chest:     Chest wall: No tenderness.  Abdominal:     General: Bowel sounds are normal. There is no distension.     Palpations: Abdomen is soft. Abdomen is not rigid.     Tenderness: There is no abdominal tenderness. There is no guarding or rebound.  Musculoskeletal:        General: Normal range of motion.     Cervical back: Normal range of motion and neck supple. No edema, erythema or rigidity. No muscular tenderness. Normal range of motion.  Lymphadenopathy:     Head:  Right side of head: No submental or submandibular adenopathy.     Left side of head: No submental or submandibular adenopathy.     Cervical: No cervical adenopathy.  Skin:    General: Skin is warm and dry.     Coloration: Skin is not pale.     Findings: No rash.     Nails: There is no clubbing.  Neurological:     Mental Status: He is alert and oriented to person, place, and time.     Sensory: No sensory deficit.  Psychiatric:        Speech: Speech normal.        Behavior: Behavior normal.    BP 135/70    Pulse 60    Resp 16    Wt 149 lb 9.6 oz (67.9 kg)    SpO2 90%    BMI 22.67 kg/m  Wt Readings from Last 3 Encounters:  03/05/21 149 lb 9.6 oz (67.9 kg)  12/09/20 127 lb 13.9 oz (58 kg)  12/09/20 140 lb (63.5 kg)     Health Maintenance Due  Topic Date Due   COLON CANCER SCREENING ANNUAL FOBT  Never done   Zoster Vaccines- Shingrix (1 of 2) Never done   Pneumonia Vaccine 24+ Years old (1 - PCV) Never done   COVID-19 Vaccine (3 - Booster for [place] series) 06/06/2019    There are no preventive care reminders to display for this patient.  No results found for: TSH Lab Results  Component Value Date   WBC 8.6 12/09/2020   HGB 14.4 12/09/2020   HCT 44.1 12/09/2020   MCV 89.5 12/09/2020   PLT 366 12/09/2020   Lab  Results  Component Value Date   NA 136 12/09/2020   K 3.7 12/09/2020   CO2 23 12/09/2020   GLUCOSE 102 (H) 12/09/2020   BUN 17 12/09/2020   CREATININE 0.83 12/09/2020   BILITOT 0.3 12/09/2020   ALKPHOS 70 12/09/2020   AST 40 12/09/2020   ALT 38 12/09/2020   PROT 8.0 12/09/2020   ALBUMIN 4.7 12/09/2020   CALCIUM 8.8 (L) 12/09/2020   ANIONGAP 7 12/09/2020   EGFR 96 12/09/2020   Lab Results  Component Value Date   CHOL 176 12/09/2020   Lab Results  Component Value Date   HDL 45 12/09/2020   Lab Results  Component Value Date   LDLCALC 97 12/09/2020   Lab Results  Component Value Date   TRIG 198 (H) 12/09/2020   Lab Results  Component Value Date   CHOLHDL 3.9 12/09/2020   Lab Results  Component Value Date   HGBA1C 5.6 12/09/2020      Assessment & Plan:   Problem List Items Addressed This Visit       Cardiovascular and Mediastinum   Hypertension    Hypertension markedly improved on amlodipine 5 mg daily maintain same dosing      Relevant Medications   amLODipine (NORVASC) 10 MG tablet     Respiratory   Moderate persistent asthma without complication    Patient has really more of an asthma syndrome we will maintain inhaled medications as prescribed      Relevant Medications   albuterol (VENTOLIN HFA) 108 (90 Base) MCG/ACT inhaler   budesonide-formoterol (SYMBICORT) 160-4.5 MCG/ACT inhaler   Other Relevant Orders   IgE   CBC with Differential/Platelet     Digestive   Chronic hepatitis C without hepatic coma ([place])    Patient does have hepatitis C with viral load of greater than  100,000 plan is to recheck liver function and refer this patient to hepatitis clinic      Relevant Orders   AMB referral to hepatitis C clinic     Other   Hx of AKA (above knee amputation), right ([place])   Other Visit Diagnoses     Vaccine for streptococcus pneumoniae and influenza    -  Primary   Colon cancer screening       Relevant Orders   Fecal occult blood,  imunochemical   Encounter for screening for HIV       Relevant Orders   HIV Antibody (routine testing w rflx)   Encounter for health-related screening       Relevant Orders   Lipid panel   Need for immunization against influenza       Relevant Orders   Flu Vaccine QUAD 66moIM (Fluarix, Fluzone & Alfiuria Quad PF) (Completed)     Patient received Prevnar 20 vaccine and flu vaccine at this visit  Meds ordered this encounter  Medications   albuterol (VENTOLIN HFA) 108 (90 Base) MCG/ACT inhaler    Sig: Inhale 2 puffs into the lungs every 4 hours as needed for wheezing or shortness of breath    Dispense:  8.5 g    Refill:  2   amLODipine (NORVASC) 10 MG tablet    Sig: Take 1 tablet (10 mg total) by mouth daily for 30 doses.    Dispense:  30 tablet    Refill:  2   budesonide-formoterol (SYMBICORT) 160-4.5 MCG/ACT inhaler    Sig: Inhale 2 puffs into the lungs 2 (two) times daily.    Dispense:  10.2 g    Refill:  12    Needs PASS   olopatadine (PATANOL) 0.1 % ophthalmic solution    Sig: Place 1 drop into both eyes 2 (two) times daily.    Dispense:  5 mL    Refill:  12    Follow-up: Return in about 2 months (around 05/03/2021).    PAsencion Noble MD

## 2021-03-05 NOTE — Assessment & Plan Note (Signed)
Patient does have hepatitis C with viral load of greater than 100,000 plan is to recheck liver function and refer this patient to hepatitis clinic

## 2021-03-07 ENCOUNTER — Other Ambulatory Visit: Payer: Self-pay

## 2021-03-11 LAB — COMPREHENSIVE METABOLIC PANEL
ALT: 47 IU/L — ABNORMAL HIGH (ref 0–44)
AST: 55 IU/L — ABNORMAL HIGH (ref 0–40)
Albumin/Globulin Ratio: 1.7 (ref 1.2–2.2)
Albumin: 4.5 g/dL (ref 3.8–4.8)
Alkaline Phosphatase: 64 IU/L (ref 44–121)
BUN/Creatinine Ratio: 14 (ref 10–24)
BUN: 11 mg/dL (ref 8–27)
Bilirubin Total: 0.5 mg/dL (ref 0.0–1.2)
CO2: 20 mmol/L (ref 20–29)
Calcium: 9.2 mg/dL (ref 8.6–10.2)
Chloride: 106 mmol/L (ref 96–106)
Creatinine, Ser: 0.77 mg/dL (ref 0.76–1.27)
Globulin, Total: 2.6 g/dL (ref 1.5–4.5)
Glucose: 91 mg/dL (ref 70–99)
Potassium: 4.4 mmol/L (ref 3.5–5.2)
Sodium: 143 mmol/L (ref 134–144)
Total Protein: 7.1 g/dL (ref 6.0–8.5)
eGFR: 98 mL/min/{1.73_m2} (ref >59)

## 2021-03-11 LAB — CBC WITH DIFFERENTIAL/PLATELET
Basophils Absolute: 0.1 10*3/uL (ref 0.0–0.2)
Basos: 1 %
EOS (ABSOLUTE): 0.4 10*3/uL (ref 0.0–0.4)
Eos: 4 %
Hematocrit: 39.8 % (ref 37.5–51.0)
Hemoglobin: 13.4 g/dL (ref 13.0–17.7)
Immature Grans (Abs): 0.1 10*3/uL (ref 0.0–0.1)
Immature Granulocytes: 1 %
Lymphocytes Absolute: 1.3 10*3/uL (ref 0.7–3.1)
Lymphs: 14 %
MCH: 29.2 pg (ref 26.6–33.0)
MCHC: 33.7 g/dL (ref 31.5–35.7)
MCV: 87 fL (ref 79–97)
Monocytes Absolute: 0.9 10*3/uL (ref 0.1–0.9)
Monocytes: 10 %
Neutrophils Absolute: 6.6 10*3/uL (ref 1.4–7.0)
Neutrophils: 70 %
Platelets: 375 10*3/uL (ref 150–450)
RBC: 4.59 x10E6/uL (ref 4.14–5.80)
RDW: 13.3 % (ref 11.6–15.4)
WBC: 9.3 10*3/uL (ref 3.4–10.8)

## 2021-03-11 LAB — IGE: IgE (Immunoglobulin E), Serum: 1708 IU/mL — ABNORMAL HIGH (ref 6–495)

## 2021-03-11 LAB — LIPID PANEL
Chol/HDL Ratio: 2.6 ratio (ref 0.0–5.0)
Cholesterol, Total: 166 mg/dL (ref 100–199)
HDL: 64 mg/dL (ref >39)
LDL Chol Calc (NIH): 82 mg/dL (ref 0–99)
Triglycerides: 110 mg/dL (ref 0–149)
VLDL Cholesterol Cal: 20 mg/dL (ref 5–40)

## 2021-03-11 LAB — HIV ANTIBODY (ROUTINE TESTING W REFLEX): HIV Screen 4th Generation wRfx: NONREACTIVE

## 2021-03-12 ENCOUNTER — Telehealth: Payer: Self-pay

## 2021-03-12 NOTE — Telephone Encounter (Signed)
Pt was called and vm was left, Information has been sent to nurse pool.    Interpreter [id number]

## 2021-03-12 NOTE — Telephone Encounter (Signed)
-----   Message from Storm Frisk, MD sent at 03/11/2021  8:40 AM EST ----- Let pt know blood counts normal, kidney normal, hiv neg, cholesterol normal, liver function is elevated. Perhaps from positive hep C ,  keep Hep C clinic appt

## 2021-03-13 ENCOUNTER — Other Ambulatory Visit: Payer: Self-pay

## 2021-03-14 ENCOUNTER — Ambulatory Visit: Payer: Self-pay | Attending: Family Medicine

## 2021-03-14 ENCOUNTER — Other Ambulatory Visit: Payer: Self-pay

## 2021-03-25 ENCOUNTER — Other Ambulatory Visit: Payer: Self-pay

## 2021-04-01 ENCOUNTER — Other Ambulatory Visit: Payer: Self-pay

## 2021-04-02 ENCOUNTER — Telehealth: Payer: Self-pay | Admitting: Critical Care Medicine

## 2021-04-02 ENCOUNTER — Other Ambulatory Visit: Payer: Self-pay

## 2021-04-02 MED ORDER — ADVAIR HFA 115-21 MCG/ACT IN AERO
2.0000 | INHALATION_SPRAY | Freq: Two times a day (BID) | RESPIRATORY_TRACT | 12 refills | Status: DC
  Filled 2021-04-02: qty 12, 30d supply, fill #0
  Filled 2021-04-25: qty 12, 30d supply, fill #1

## 2021-04-02 NOTE — Telephone Encounter (Signed)
-----   Message from Weldon Picking, CPhT sent at 04/02/2021  8:00 AM EST ----- ?This is another patient, can we change his Symbicort to Advair or Breo?  Symbicort patient assistance program is requiring Medicaid Denial letters at the moment so we are unable to enroll these patients in a program. ? ?

## 2021-04-02 NOTE — Telephone Encounter (Signed)
I will remember this, yes rx changed ?

## 2021-04-03 ENCOUNTER — Other Ambulatory Visit: Payer: Self-pay

## 2021-04-07 ENCOUNTER — Other Ambulatory Visit: Payer: Self-pay

## 2021-04-22 ENCOUNTER — Other Ambulatory Visit: Payer: Self-pay

## 2021-04-25 ENCOUNTER — Other Ambulatory Visit: Payer: Self-pay

## 2021-05-05 ENCOUNTER — Other Ambulatory Visit: Payer: Self-pay

## 2021-05-05 ENCOUNTER — Other Ambulatory Visit: Payer: Self-pay | Admitting: Critical Care Medicine

## 2021-05-06 ENCOUNTER — Other Ambulatory Visit: Payer: Self-pay

## 2021-05-06 MED ORDER — ALBUTEROL SULFATE HFA 108 (90 BASE) MCG/ACT IN AERS
INHALATION_SPRAY | RESPIRATORY_TRACT | 2 refills | Status: DC
  Filled 2021-05-06: qty 8.5, 16d supply, fill #0
  Filled 2021-05-26: qty 8.5, 16d supply, fill #1
  Filled 2021-06-24: qty 8.5, 16d supply, fill #2

## 2021-05-06 NOTE — Telephone Encounter (Signed)
Requested Prescriptions  ?Pending Prescriptions Disp Refills  ?? albuterol (VENTOLIN HFA) 108 (90 Base) MCG/ACT inhaler 8.5 g 2  ?  Sig: Inhale 2 puffs into the lungs every 4 hours as needed for wheezing or shortness of breath  ?  ? Pulmonology:  Beta Agonists 2 Passed - 05/05/2021  1:47 PM  ?  ?  Passed - Last BP in normal range  ?  BP Readings from Last 1 Encounters:  ?03/05/21 135/70  ?   ?  ?  Passed - Last Heart Rate in normal range  ?  Pulse Readings from Last 1 Encounters:  ?03/05/21 60  ?   ?  ?  Passed - Valid encounter within last 12 months  ?  Recent Outpatient Visits   ?      ? 2 months ago HTN (hypertension), malignant  ? [facility] Storm Frisk, MD  ? 4 months ago Respiratory distress  ? [facility] Storm Frisk, MD  ?  ?  ?Future Appointments   ?        ? In 1 month Delford Field Charlcie Cradle, MD [facility]  ?  ? ?  ?  ?  ? ?

## 2021-05-07 ENCOUNTER — Other Ambulatory Visit: Payer: Self-pay | Admitting: Pharmacist

## 2021-05-07 ENCOUNTER — Other Ambulatory Visit: Payer: Self-pay

## 2021-05-07 MED ORDER — FLUTICASONE FUROATE-VILANTEROL 100-25 MCG/ACT IN AEPB
1.0000 | INHALATION_SPRAY | Freq: Every day | RESPIRATORY_TRACT | 2 refills | Status: DC
  Filled 2021-05-07: qty 60, 60d supply, fill #0
  Filled 2021-05-12: qty 60, 30d supply, fill #0

## 2021-05-08 ENCOUNTER — Other Ambulatory Visit: Payer: Self-pay

## 2021-05-12 ENCOUNTER — Other Ambulatory Visit: Payer: Self-pay

## 2021-05-13 ENCOUNTER — Ambulatory Visit: Payer: Self-pay | Admitting: Critical Care Medicine

## 2021-05-26 ENCOUNTER — Other Ambulatory Visit: Payer: Self-pay

## 2021-06-05 ENCOUNTER — Emergency Department (HOSPITAL_COMMUNITY): Payer: Self-pay

## 2021-06-05 ENCOUNTER — Other Ambulatory Visit: Payer: Self-pay

## 2021-06-05 ENCOUNTER — Inpatient Hospital Stay (HOSPITAL_COMMUNITY)
Admission: EM | Admit: 2021-06-05 | Discharge: 2021-06-10 | DRG: 603 | Disposition: A | Discharge status: Home / Self Care | Payer: Self-pay | Source: Ambulatory Visit | Attending: Family Medicine | Admitting: Family Medicine

## 2021-06-05 ENCOUNTER — Emergency Department (HOSPITAL_COMMUNITY)
Admit: 2021-06-05 | Discharge: 2021-06-05 | Disposition: A | Discharge status: Home / Self Care | Payer: Self-pay | Attending: Emergency Medicine | Admitting: Emergency Medicine

## 2021-06-05 ENCOUNTER — Encounter (HOSPITAL_COMMUNITY): Payer: Self-pay | Admitting: *Deleted

## 2021-06-05 DIAGNOSIS — Z79899 Other long term (current) drug therapy: Secondary | ICD-10-CM

## 2021-06-05 DIAGNOSIS — Z7951 Long term (current) use of inhaled steroids: Secondary | ICD-10-CM

## 2021-06-05 DIAGNOSIS — Z87891 Personal history of nicotine dependence: Secondary | ICD-10-CM

## 2021-06-05 DIAGNOSIS — L0390 Cellulitis, unspecified: Secondary | ICD-10-CM | POA: Diagnosis present

## 2021-06-05 DIAGNOSIS — M7989 Other specified soft tissue disorders: Secondary | ICD-10-CM

## 2021-06-05 DIAGNOSIS — J449 Chronic obstructive pulmonary disease, unspecified: Secondary | ICD-10-CM | POA: Diagnosis present

## 2021-06-05 DIAGNOSIS — M609 Myositis, unspecified: Secondary | ICD-10-CM | POA: Diagnosis present

## 2021-06-05 DIAGNOSIS — M109 Gout, unspecified: Secondary | ICD-10-CM | POA: Diagnosis present

## 2021-06-05 DIAGNOSIS — D75839 Thrombocytosis, unspecified: Secondary | ICD-10-CM | POA: Diagnosis present

## 2021-06-05 DIAGNOSIS — A419 Sepsis, unspecified organism: Secondary | ICD-10-CM | POA: Diagnosis present

## 2021-06-05 DIAGNOSIS — I10 Essential (primary) hypertension: Secondary | ICD-10-CM | POA: Diagnosis present

## 2021-06-05 DIAGNOSIS — Z8249 Family history of ischemic heart disease and other diseases of the circulatory system: Secondary | ICD-10-CM

## 2021-06-05 DIAGNOSIS — Z602 Problems related to living alone: Secondary | ICD-10-CM | POA: Diagnosis present

## 2021-06-05 DIAGNOSIS — B1920 Unspecified viral hepatitis C without hepatic coma: Secondary | ICD-10-CM | POA: Diagnosis present

## 2021-06-05 DIAGNOSIS — Z89611 Acquired absence of right leg above knee: Secondary | ICD-10-CM

## 2021-06-05 DIAGNOSIS — L03116 Cellulitis of left lower limb: Secondary | ICD-10-CM

## 2021-06-05 DIAGNOSIS — J4540 Moderate persistent asthma, uncomplicated: Secondary | ICD-10-CM | POA: Diagnosis present

## 2021-06-05 HISTORY — DX: Cellulitis of left lower limb: L03.116

## 2021-06-05 LAB — URIC ACID: Uric Acid, Serum: 4.4 mg/dL (ref 3.7–8.6)

## 2021-06-05 LAB — CBC
HCT: 34.6 % — ABNORMAL LOW (ref 39.0–52.0)
Hemoglobin: 11.4 g/dL — ABNORMAL LOW (ref 13.0–17.0)
MCH: 28.9 pg (ref 26.0–34.0)
MCHC: 32.9 g/dL (ref 30.0–36.0)
MCV: 87.8 fL (ref 80.0–100.0)
Platelets: 675 10*3/uL — ABNORMAL HIGH (ref 150–400)
RBC: 3.94 MIL/uL — ABNORMAL LOW (ref 4.22–5.81)
RDW: 12.2 % (ref 11.5–15.5)
WBC: 13.4 10*3/uL — ABNORMAL HIGH (ref 4.0–10.5)
nRBC: 0 % (ref 0.0–0.2)

## 2021-06-05 LAB — C-REACTIVE PROTEIN: CRP: 13.3 mg/dL — ABNORMAL HIGH (ref <1.0)

## 2021-06-05 LAB — COMPREHENSIVE METABOLIC PANEL
ALT: 29 U/L (ref 0–44)
AST: 27 U/L (ref 15–41)
Albumin: 2.4 g/dL — ABNORMAL LOW (ref 3.5–5.0)
Alkaline Phosphatase: 45 U/L (ref 38–126)
Anion gap: 5 (ref 5–15)
BUN: 13 mg/dL (ref 8–23)
CO2: 24 mmol/L (ref 22–32)
Calcium: 8 mg/dL — ABNORMAL LOW (ref 8.9–10.3)
Chloride: 106 mmol/L (ref 98–111)
Creatinine, Ser: 0.79 mg/dL (ref 0.61–1.24)
GFR, Estimated: >60 mL/min (ref >60)
Glucose, Bld: 107 mg/dL — ABNORMAL HIGH (ref 70–99)
Potassium: 4.4 mmol/L (ref 3.5–5.1)
Sodium: 135 mmol/L (ref 135–145)
Total Bilirubin: 0.6 mg/dL (ref 0.3–1.2)
Total Protein: 6.5 g/dL (ref 6.5–8.1)

## 2021-06-05 LAB — BASIC METABOLIC PANEL
Anion gap: 9 (ref 5–15)
BUN: 15 mg/dL (ref 8–23)
CO2: 22 mmol/L (ref 22–32)
Calcium: 8.5 mg/dL — ABNORMAL LOW (ref 8.9–10.3)
Chloride: 103 mmol/L (ref 98–111)
Creatinine, Ser: 1 mg/dL (ref 0.61–1.24)
GFR, Estimated: >60 mL/min (ref >60)
Glucose, Bld: 102 mg/dL — ABNORMAL HIGH (ref 70–99)
Potassium: 5.6 mmol/L — ABNORMAL HIGH (ref 3.5–5.1)
Sodium: 134 mmol/L — ABNORMAL LOW (ref 135–145)

## 2021-06-05 LAB — BRAIN NATRIURETIC PEPTIDE: B Natriuretic Peptide: 43.7 pg/mL (ref 0.0–100.0)

## 2021-06-05 LAB — SEDIMENTATION RATE: Sed Rate: 80 mm/hr — ABNORMAL HIGH (ref 0–16)

## 2021-06-05 LAB — MAGNESIUM: Magnesium: 2.2 mg/dL (ref 1.7–2.4)

## 2021-06-05 LAB — CK: Total CK: 103 U/L (ref 49–397)

## 2021-06-05 LAB — PHOSPHORUS: Phosphorus: 3.5 mg/dL (ref 2.5–4.6)

## 2021-06-05 MED ORDER — IOHEXOL 300 MG/ML  SOLN
100.0000 mL | Freq: Once | INTRAMUSCULAR | Status: AC | PRN
  Administered 2021-06-05: 100 mL via INTRAVENOUS

## 2021-06-05 MED ORDER — SODIUM CHLORIDE 0.9 % IV BOLUS
1000.0000 mL | Freq: Once | INTRAVENOUS | Status: AC
Start: 2021-06-05 — End: 2021-06-06
  Administered 2021-06-05: 1000 mL via INTRAVENOUS

## 2021-06-05 MED ORDER — SODIUM CHLORIDE 0.9 % IV SOLN
1.0000 g | Freq: Once | INTRAVENOUS | Status: AC
  Administered 2021-06-05: 1 g via INTRAVENOUS
  Filled 2021-06-05: qty 10

## 2021-06-05 MED ORDER — VANCOMYCIN HCL IN DEXTROSE 1-5 GM/200ML-% IV SOLN
1000.0000 mg | Freq: Once | INTRAVENOUS | Status: AC
  Administered 2021-06-05: 1000 mg via INTRAVENOUS
  Filled 2021-06-05: qty 200

## 2021-06-05 MED ORDER — VANCOMYCIN HCL 750 MG/150ML IV SOLN
750.0000 mg | Freq: Two times a day (BID) | INTRAVENOUS | Status: DC
  Administered 2021-06-06 – 2021-06-08 (×4): 750 mg via INTRAVENOUS
  Filled 2021-06-05 (×7): qty 150

## 2021-06-05 MED ORDER — SODIUM CHLORIDE 0.9 % IV SOLN
2.0000 g | Freq: Three times a day (TID) | INTRAVENOUS | Status: DC
  Administered 2021-06-06 – 2021-06-08 (×8): 2 g via INTRAVENOUS
  Filled 2021-06-05 (×8): qty 12.5

## 2021-06-05 MED ORDER — SODIUM CHLORIDE 0.9 % IV SOLN
2.0000 g | Freq: Once | INTRAVENOUS | Status: AC
  Administered 2021-06-05: 2 g via INTRAVENOUS
  Filled 2021-06-05: qty 12.5

## 2021-06-05 NOTE — Assessment & Plan Note (Signed)
Allow permissive hypertension restart blood pressure medicines when able to tolerate ?
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
## 2021-06-05 NOTE — Assessment & Plan Note (Signed)
-  admit per? cellulitis protocol will  ?    change to vancomycin given purulent discharge, ?    ?     plain films showed:   no evidence of air  no evidence of osteomyelitis   no    foreign   Objects ?CT is worrisome for Moderate myofasciitis involving the lower leg musculature but no evidence of pyomyositis. ?     Will obtain MRSA screening,  ?    further antibiotic adjustment pending above results ? ?

## 2021-06-05 NOTE — Progress Notes (Signed)
Left lower extremity venous duplex has been completed. ?Preliminary results can be found in CV Proc through chart review.  ?Results were given to Dr. Vanita Panda. ? ?06/05/21 4:50 PM ?Carlos Levering RVT   ?

## 2021-06-05 NOTE — H&P (Signed)
? ? ?Jerry Bradley [id number] DOB: 12/02/1950 DOA: 06/05/2021 ? ? ?  ?PCP: Storm FriskWright, Patrick E, MD   ?Outpatient Specialists none ?  ? ?Patient arrived to ER on 06/05/21 at 1313 ?Referred by Attending Therisa Doyneoutova, Breton Berns, MD ? ? ?Patient coming from:   ? home Lives alone,    ?  ? ?Chief Complaint:   ?Chief Complaint  ?Patient presents with  ? Leg Swelling  ? ? ?HPI: ?Jerry Bradley is a 71 y.o. male with medical history significant of HTN Right AKA ?  ? ?Presented with   left leg pain and swelling ?Left leg swelling and drainage ?No fever no chills ?No nausea no vomiting ?Patient is status [facility] has been having swelling throughout his left lower extremity normal sensation ?Symptoms ongoing for the past few days ?Reports he stopped drinking few months ago he used to drink few beers a day.  He also have stopped smoking about 21 days ago. ?Denies any fever reports mild pain in his left lower extremity ?Denies any injury no chest pain or shortness of breath ?Regarding pertinent Chronic problems:   ?  ? ? HTN on NOrvasc ?  ? ? ?While in ER: ?  ?Imaging showed no evidence of osteomyelitis or foreign objects no evidence of free air but CT scan did show progression to myositis although no abscess formation noted no evidence of ?pyomyositis.  No evidence of necrotizing fasciitis ?  ?Dopplers negative for DVT ? ?CXR -  NON acute ?  ?CT leg 1. Diffuse skin thickening and fairly marked subcutaneous soft ?tissue swelling/edema/fluid involving the entire left lower ?extremity but most significantly below the knee. This is consistent ?with cellulitis. No discrete rim enhancing fluid collection to ?suggest a drainable soft tissue abscess. ?2. Moderate myofasciitis involving the lower leg musculature but no ?evidence of pyomyositis. ? ?Patient was started on ceftriaxone and vancomycin with clinical improvement ? ?Following Medications were ordered in ER: ?Medications   ?sodium chloride 0.9 % bolus 1,000 mL (has no administration in time range)  ?cefTRIAXone (ROCEPHIN) 1 g in sodium chloride 0.9 % 100 mL IVPB (0 g Intravenous Stopped 06/05/21 1937)  ?iohexol (OMNIPAQUE) 300 MG/ML solution 100 mL (100 mLs Intravenous Contrast Given 06/05/21 1918)  ?vancomycin (VANCOCIN) IVPB 1000 mg/200 mL premix (1,000 mg Intravenous New Bag/Given 06/05/21 2105)  ?   ?ED Triage Vitals  ?Enc Vitals Group  ?   BP 06/05/21 1331 128/73  ?   Pulse Rate 06/05/21 1331 63  ?   Resp 06/05/21 1331 18  ?   Temp 06/05/21 1331 98.1 ?F (36.7 ?C)  ?   Temp Source 06/05/21 1331 Oral  ?   SpO2 06/05/21 1331 99 %  ?   Weight --   ?   Height --   ?   Head Circumference --   ?   Peak Flow --   ?   Pain Score 06/05/21 1336 8  ?   Pain Loc --   ?   Pain Edu? --   ?   Excl. in GC? --   ?WJXB(14)@TMAX(24)@    ? _________________________________________ ?Significant initial  Findings: ?Abnormal Labs Reviewed  ?CBC - Abnormal; Notable for the following components:  ?    Result Value  ? WBC 13.4 (*)   ? RBC 3.94 (*)   ? Hemoglobin 11.4 (*)   ? HCT 34.6 (*)   ? Platelets 675 (*)   ? All other components  within normal limits  ?BASIC METABOLIC PANEL - Abnormal; Notable for the following components:  ? Sodium 134 (*)   ? Potassium 5.6 (*)   ? Glucose, Bld 102 (*)   ? Calcium 8.5 (*)   ? All other components within normal limits  ?C-REACTIVE PROTEIN - Abnormal; Notable for the following components:  ? CRP 13.3 (*)   ? All other components within normal limits  ?SEDIMENTATION RATE - Abnormal; Notable for the following components:  ? Sed Rate 80 (*)   ? All other components within normal limits  ? ?  ?  ?ECG: Ordered ?Personally reviewed by me showing: ?HR : 63 ?Rhythm: Sinus rhythm ?Abnormal R-wave progression, early ?QTC 434 ? ?   ?The recent clinical data is shown below. ?Vitals:  ? 06/05/21 1900 06/05/21 1915 06/05/21 2000 06/05/21 2100  ?BP: 119/61 126/64 121/61 (!) 105/49  ?Pulse: 64 64 60 68  ?Resp: 18 20 20  (!) 23  ?Temp:       ?TempSrc:      ?SpO2: 97% 96% 100% 100%  ? ?  ?WBC ? ?   ?Component Value Date/Time  ? WBC 13.4 (H) 06/05/2021 1408  ? LYMPHSABS 1.3 03/05/2021 1016  ? MONOABS 0.8 12/09/2020 1749  ? EOSABS 0.4 03/05/2021 1016  ? BASOSABS 0.1 03/05/2021 1016  ? ? ?   ? ?Lactic Acid, Venous ?No results found for: LATICACIDVEN ? ? ?Procalcitonin   Ordered ?  ? ? UA  ordered ?  ?Urine analysis: ?No results found for: COLORURINE, APPEARANCEUR, LABSPEC, PHURINE, GLUCOSEU, HGBUR, BILIRUBINUR, KETONESUR, PROTEINUR, UROBILINOGEN, NITRITE, LEUKOCYTESUR ?  ?_______________________________________________ ?Hospitalist was called for admission for   Myofasciitis ?   ?Cellulitis of left lower extremity ?   ?The following Work up has been ordered so far: ? ?Orders Placed This Encounter  ?Procedures  ? DG Chest 2 View  ? DG Foot Complete Left  ? CT EXTREMITY LOWER LEFT W CONTRAST  ? CBC  ? Basic metabolic panel  ? Brain natriuretic peptide  ? C-reactive protein  ? Sedimentation rate  ? Uric acid  ? Comprehensive metabolic panel  ? CK  ? Magnesium  ? Phosphorus  ? Prealbumin  ? Lactic acid, plasma  ? Cardiac monitoring  ? Consult to hospitalist  ? Nutritional services consult  ? vancomycin per pharmacy consult  ? ceFEPime (MAXIPIME) per pharmacy consult           ? ED EKG  ? EKG 12-Lead  ? Admit to Inpatient (patient's expected length of stay will be greater than 2 midnights or inpatient only procedure)  ? VAS 05/03/2021 LOWER EXTREMITY VENOUS (DVT) (7a-7p)  ?  ? ?OTHER Significant initial  Findings: ? ?labs showing: ? ?  ?Recent Labs  ?Lab 06/05/21 ?1408 06/05/21 ?2305  ?NA 134* 135  ?K 5.6* 4.4  ?CO2 22 24  ?GLUCOSE 102* 107*  ?BUN 15 13  ?CREATININE 1.00 0.79  ?CALCIUM 8.5* 8.0*  ?MG  --  2.2  ?PHOS  --  3.5  ? ? ?Cr   ,  Up from baseline see below ?Lab Results  ?Component Value Date  ? CREATININE 1.00 06/05/2021  ? CREATININE 0.77 03/05/2021  ? CREATININE 0.83 12/09/2020  ? ? ?Recent Labs  ?Lab 06/05/21 ?2305  ?AST 27  ?ALT 29  ?ALKPHOS 45  ?BILITOT  0.6  ?PROT 6.5  ?ALBUMIN 2.4*  ? ?Lab Results  ?Component Value Date  ? CALCIUM 8.5 (L) 06/05/2021  ?  ?Plt: ?Lab Results  ?Component Value  Date  ? PLT 675 (H) 06/05/2021  ? ?  ?  ?COVID-19 Labs ? ?Recent Labs  ?  06/05/21 ?1408  ?CRP 13.3*  ? ? ?Lab Results  ?Component Value Date  ? SARSCOV2NAA NEGATIVE 12/09/2020  ? SARSCOV2NAA NEGATIVE 09/24/2020  ?  ?   ?Recent Labs  ?Lab 06/05/21 ?1408  ?WBC 13.4*  ?HGB 11.4*  ?HCT 34.6*  ?MCV 87.8  ?PLT 675*  ? ? ?HG/HCT   Down   from baseline see below ?   ?Component Value Date/Time  ? HGB 11.4 (L) 06/05/2021 1408  ? HGB 13.4 03/05/2021 1016  ? HCT 34.6 (L) 06/05/2021 1408  ? HCT 39.8 03/05/2021 1016  ? MCV 87.8 06/05/2021 1408  ? MCV 87 03/05/2021 1016  ? ?  ? ?Cardiac Panel (last 3 results) ?Recent Labs  ?  06/05/21 ?2305  ?CKTOTAL 103  ? ? .car ?BNP (last 3 results) ?Recent Labs  ?  12/09/20 ?1815 06/05/21 ?1408  ?BNP 15.8 43.7  ? ?  ? ?DM  labs:  ?HbA1C: ?Recent Labs  ?  12/09/20 ?1622  ?HGBA1C 5.6  ? ?   ?Cultures: ?No results found for: SDES, SPECREQUEST, CULT, REPTSTATUS ?  ?Radiological Exams on Admission: ?DG Chest 2 View ? ?Result Date: 06/05/2021 ?CLINICAL DATA:  Left leg pain. EXAM: CHEST - 2 VIEW COMPARISON:  12/09/2020 FINDINGS: The cardiac silhouette, mediastinal and hilar contours are within normal limits. The lungs are clear of an acute process. No infiltrates, edema or effusions. No pulmonary lesions. Bilateral nipple shadows are noted incidentally. The bony thorax is intact. There is a midthoracic compression deformity unchanged since prior chest x-ray. IMPRESSION: No acute cardiopulmonary findings. Electronically Signed   By: Rudie Meyer M.D.   On: 06/05/2021 14:49  ? ?DG Foot Complete Left ? ?Result Date: 06/05/2021 ?CLINICAL DATA:  Left lower extremity pain and swelling. EXAM: LEFT FOOT - COMPLETE 3+ VIEW COMPARISON:  None Available. FINDINGS: The joint spaces are maintained. No fracture or bone lesion is identified. Moderate degenerative changes at the  first MTP joint with a mild hallux valgus deformity but no obvious erosions. A small calcaneal heel spur is noted. IMPRESSION: 1. Moderate degenerative changes at the first MTP joint with a mild hallux valgus defor

## 2021-06-05 NOTE — ED Provider Triage Note (Signed)
Emergency Medicine Provider Triage Evaluation Note ? ?Jerry Bradley , a 71 y.o. male  was evaluated in triage.  Pt complains of significant left leg swelling, leaking week.  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  He denies chest pain, shortness of breath, history of heart failure, kidney disease.  He takes medication for asthma, as well as blood pressure.  He denies recent fever, chills, feeling of numbness in the left leg. His right leg is prosthetic. ? ?Review of Systems  ?Positive: Left leg swelling, drainage ?Negative: Chest pain, shob ? ?Physical Exam  ?BP 128/73 (BP Location: Right Arm)   Pulse 63   Temp 98.1 ?F (36.7 ?C) (Oral)   Resp 18   SpO2 99%  ?Gen:   Awake, no distress   ?Resp:  Normal effort  ?MSK:   Moves extremities without difficulty  ?Other:  2-3+ pitting edema, redness, ulcers, leaking of left leg. Thready but palpable DP, PT pulses on left ? ?Medical Decision Making  ?Medically screening exam initiated at 1:54 PM.  Appropriate orders placed.  Jerry Bradley was informed that the remainder of the evaluation will be completed by another provider, this initial triage assessment does not replace that evaluation, and the importance of remaining in the ED until their evaluation is complete. ? ?Workup initiated ?  ?Anselmo Pickler, PA-C ?06/05/21 1356 ? ?

## 2021-06-05 NOTE — Subjective & Objective (Addendum)
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

## 2021-06-05 NOTE — Assessment & Plan Note (Signed)
Albuterol as needed 

## 2021-06-05 NOTE — Progress Notes (Signed)
Pharmacy Antibiotic Note ? ?Jerry Bradley is a 71 y.o. male admitted on 06/05/2021 with cellulitis of left lower extremity.  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 Pharmacy has been consulted for Vancomycin and Cefepime dosing.  Patient received Vanomycin 1gm IV and Ceftriaxone 1gm IV x 1 dose in ED. ? ?Plan: ?Cefepime 2gm IV q8h ?Vancomycin 750 mg IV Q 12 hrs. Goal AUC 400-550.  Expected AUC: 505  SCr used: 1 ?F/u blood cultures ?Follow renal function ? ?  ? ?Temp (24hrs), Avg:98.1 ?F (36.7 ?C), Min:98.1 ?F (36.7 ?C), Max:98.1 ?F (36.7 ?C) ? ?Recent Labs  ?Lab 06/05/21 ?1408  ?WBC 13.4*  ?CREATININE 1.00  ?  ?CrCl cannot be calculated (Unknown ideal weight.).   ? ?No Known Allergies ? ?Antimicrobials this admission: ?5/11 Ceftriaxone x 1 ?5/11 Cefepime >>   ?5/11 Vancomycin >> ? ?Dose adjustments this admission: ?  ? ?Microbiology results: ?5/11 BCx:   ?  ? ?Thank you for allowing pharmacy to be a part of this patient?s care. ? ?Maryellen Pile, PharmD ?06/05/2021 11:27 PM ? ?

## 2021-06-05 NOTE — Assessment & Plan Note (Signed)
Chronic-stable.

## 2021-06-05 NOTE — ED Triage Notes (Signed)
Cesar #[id number]  Left leg swelling, including groin area. Drainage from lower leg., sent by clinic to have uric acid checked. ?

## 2021-06-06 LAB — COMPREHENSIVE METABOLIC PANEL
ALT: 27 U/L (ref 0–44)
AST: 26 U/L (ref 15–41)
Albumin: 2.3 g/dL — ABNORMAL LOW (ref 3.5–5.0)
Alkaline Phosphatase: 52 U/L (ref 38–126)
Anion gap: 5 (ref 5–15)
BUN: 12 mg/dL (ref 8–23)
CO2: 22 mmol/L (ref 22–32)
Calcium: 8.1 mg/dL — ABNORMAL LOW (ref 8.9–10.3)
Chloride: 110 mmol/L (ref 98–111)
Creatinine, Ser: 0.76 mg/dL (ref 0.61–1.24)
GFR, Estimated: >60 mL/min (ref >60)
Glucose, Bld: 97 mg/dL (ref 70–99)
Potassium: 4.5 mmol/L (ref 3.5–5.1)
Sodium: 137 mmol/L (ref 135–145)
Total Bilirubin: 0.5 mg/dL (ref 0.3–1.2)
Total Protein: 6.4 g/dL — ABNORMAL LOW (ref 6.5–8.1)

## 2021-06-06 LAB — CBC WITH DIFFERENTIAL/PLATELET
Abs Immature Granulocytes: 0.8 10*3/uL — ABNORMAL HIGH (ref 0.00–0.07)
Basophils Absolute: 0.2 10*3/uL — ABNORMAL HIGH (ref 0.0–0.1)
Basophils Relative: 1 %
Eosinophils Absolute: 0.5 10*3/uL (ref 0.0–0.5)
Eosinophils Relative: 4 %
HCT: 31.4 % — ABNORMAL LOW (ref 39.0–52.0)
Hemoglobin: 10.5 g/dL — ABNORMAL LOW (ref 13.0–17.0)
Immature Granulocytes: 7 %
Lymphocytes Relative: 14 %
Lymphs Abs: 1.6 10*3/uL (ref 0.7–4.0)
MCH: 29.4 pg (ref 26.0–34.0)
MCHC: 33.4 g/dL (ref 30.0–36.0)
MCV: 88 fL (ref 80.0–100.0)
Monocytes Absolute: 0.8 10*3/uL (ref 0.1–1.0)
Monocytes Relative: 7 %
Neutro Abs: 7.6 10*3/uL (ref 1.7–7.7)
Neutrophils Relative %: 67 %
Platelets: 671 10*3/uL — ABNORMAL HIGH (ref 150–400)
RBC: 3.57 MIL/uL — ABNORMAL LOW (ref 4.22–5.81)
RDW: 12.3 % (ref 11.5–15.5)
WBC: 11.4 10*3/uL — ABNORMAL HIGH (ref 4.0–10.5)
nRBC: 0 % (ref 0.0–0.2)

## 2021-06-06 LAB — LACTIC ACID, PLASMA: Lactic Acid, Venous: 0.8 mmol/L (ref 0.5–1.9)

## 2021-06-06 LAB — PREALBUMIN: Prealbumin: 5.1 mg/dL — ABNORMAL LOW (ref 18–38)

## 2021-06-06 LAB — HEMOGLOBIN A1C
Hgb A1c MFr Bld: 5.7 % — ABNORMAL HIGH (ref 4.8–5.6)
Mean Plasma Glucose: 116.89 mg/dL

## 2021-06-06 LAB — MRSA NEXT GEN BY PCR, NASAL: MRSA by PCR Next Gen: NOT DETECTED

## 2021-06-06 MED ORDER — SODIUM CHLORIDE 0.9 % IV SOLN
75.0000 mL/h | INTRAVENOUS | Status: AC
  Administered 2021-06-06: 75 mL/h via INTRAVENOUS

## 2021-06-06 MED ORDER — FLUTICASONE FUROATE-VILANTEROL 100-25 MCG/ACT IN AEPB
1.0000 | INHALATION_SPRAY | Freq: Every day | RESPIRATORY_TRACT | Status: DC
  Administered 2021-06-07 – 2021-06-10 (×4): 1 via RESPIRATORY_TRACT
  Filled 2021-06-06: qty 28

## 2021-06-06 MED ORDER — HYDROCODONE-ACETAMINOPHEN 5-325 MG PO TABS
1.0000 | ORAL_TABLET | ORAL | Status: DC | PRN
  Administered 2021-06-06 – 2021-06-10 (×12): 2 via ORAL
  Filled 2021-06-06 (×13): qty 2

## 2021-06-06 MED ORDER — ACETAMINOPHEN 650 MG RE SUPP: 650.0000 mg | Freq: Four times a day (QID) | RECTAL | Status: DC | PRN

## 2021-06-06 MED ORDER — SODIUM CHLORIDE 0.9 % IV SOLN
75.0000 mL/h | INTRAVENOUS | Status: DC
  Administered 2021-06-06 – 2021-06-08 (×4): 75 mL/h via INTRAVENOUS

## 2021-06-06 MED ORDER — ACETAMINOPHEN 325 MG PO TABS: 650.0000 mg | ORAL_TABLET | Freq: Four times a day (QID) | ORAL | Status: DC | PRN

## 2021-06-06 MED ORDER — ALBUTEROL SULFATE (2.5 MG/3ML) 0.083% IN NEBU: 2.5000 mg | INHALATION_SOLUTION | RESPIRATORY_TRACT | Status: DC | PRN

## 2021-06-06 MED ORDER — FENTANYL CITRATE PF 50 MCG/ML IJ SOSY: 12.5000 ug | PREFILLED_SYRINGE | INTRAMUSCULAR | Status: DC | PRN
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
## 2021-06-07 LAB — COMPREHENSIVE METABOLIC PANEL
ALT: 23 U/L (ref 0–44)
AST: 21 U/L (ref 15–41)
Albumin: 2.3 g/dL — ABNORMAL LOW (ref 3.5–5.0)
Alkaline Phosphatase: 38 U/L (ref 38–126)
Anion gap: 5 (ref 5–15)
BUN: 8 mg/dL (ref 8–23)
CO2: 22 mmol/L (ref 22–32)
Calcium: 8 mg/dL — ABNORMAL LOW (ref 8.9–10.3)
Chloride: 109 mmol/L (ref 98–111)
Creatinine, Ser: 0.72 mg/dL (ref 0.61–1.24)
GFR, Estimated: >60 mL/min (ref >60)
Glucose, Bld: 91 mg/dL (ref 70–99)
Potassium: 4 mmol/L (ref 3.5–5.1)
Sodium: 136 mmol/L (ref 135–145)
Total Bilirubin: 0.4 mg/dL (ref 0.3–1.2)
Total Protein: 6.2 g/dL — ABNORMAL LOW (ref 6.5–8.1)

## 2021-06-07 LAB — CBC
HCT: 31.2 % — ABNORMAL LOW (ref 39.0–52.0)
Hemoglobin: 10 g/dL — ABNORMAL LOW (ref 13.0–17.0)
MCH: 28.7 pg (ref 26.0–34.0)
MCHC: 32.1 g/dL (ref 30.0–36.0)
MCV: 89.4 fL (ref 80.0–100.0)
Platelets: 681 10*3/uL — ABNORMAL HIGH (ref 150–400)
RBC: 3.49 MIL/uL — ABNORMAL LOW (ref 4.22–5.81)
RDW: 12.5 % (ref 11.5–15.5)
WBC: 8.9 10*3/uL (ref 4.0–10.5)
nRBC: 0 % (ref 0.0–0.2)

## 2021-06-07 NOTE — Plan of Care (Signed)
?  Problem: Clinical Measurements: ?Goal: Ability to avoid or minimize complications of infection will improve ?Outcome: Progressing ?  ?Problem: Skin Integrity: ?Goal: Skin integrity will improve ?Outcome: Progressing ?  ?Problem: Clinical Measurements: ?Goal: Ability to avoid or minimize complications of infection will improve ?Outcome: Progressing ?  ?Problem: Skin Integrity: ?Goal: Skin integrity will improve ?Outcome: Progressing ?  ?Problem: Education: ?Goal: Knowledge of General Education information will improve ?Description: Including pain rating scale, medication(s)/side effects and non-pharmacologic comfort measures ?Outcome: Progressing ?  ?Problem: Health Behavior/Discharge Planning: ?Goal: Ability to manage health-related needs will improve ?Outcome: Progressing ?  ?Problem: Clinical Measurements: ?Goal: Ability to maintain clinical measurements within normal limits will improve ?Outcome: Progressing ?Goal: Will remain free from infection ?Outcome: Progressing ?Goal: Diagnostic test results will improve ?Outcome: Progressing ?Goal: Respiratory complications will improve ?Outcome: Progressing ?Goal: Cardiovascular complication will be avoided ?Outcome: Progressing ?  ?Problem: Activity: ?Goal: Risk for activity intolerance will decrease ?Outcome: Progressing ?  ?Problem: Nutrition: ?Goal: Adequate nutrition will be maintained ?Outcome: Progressing ?  ?Problem: Coping: ?Goal: Level of anxiety will decrease ?Outcome: Progressing ?  ?Problem: Elimination: ?Goal: Will not experience complications related to bowel motility ?Outcome: Progressing ?Goal: Will not experience complications related to urinary retention ?Outcome: Progressing ?  ?Problem: Pain Managment: ?Goal: General experience of comfort will improve ?Outcome: Progressing ?  ?Problem: Safety: ?Goal: Ability to remain free from injury will improve ?Outcome: Progressing ?  ?Problem: Skin Integrity: ?Goal: Risk for impaired skin integrity will  decrease ?Outcome: Progressing ?  ?
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
## 2021-06-08 MED ORDER — ADULT MULTIVITAMIN W/MINERALS CH
1.0000 | ORAL_TABLET | Freq: Every day | ORAL | Status: DC
  Administered 2021-06-08 – 2021-06-09 (×2): 1 via ORAL
  Filled 2021-06-08 (×2): qty 1

## 2021-06-08 MED ORDER — JUVEN PO PACK
1.0000 | PACK | Freq: Two times a day (BID) | ORAL | Status: DC
  Administered 2021-06-08 – 2021-06-09 (×3): 1 via ORAL
  Filled 2021-06-08 (×4): qty 1

## 2021-06-08 MED ORDER — DOXYCYCLINE HYCLATE 100 MG PO TABS
100.0000 mg | ORAL_TABLET | Freq: Two times a day (BID) | ORAL | Status: DC
Start: 2021-06-08 — End: 2021-06-10
  Administered 2021-06-08 – 2021-06-09 (×3): 100 mg via ORAL
  Filled 2021-06-08 (×4): qty 1

## 2021-06-08 NOTE — Consult Note (Signed)
WOC Nurse Consult Note: ?Reason for Consult:Full thickness wound to left LE ?Wound type: infectious ?Pressure Injury POA: N/A ?Measurement: Per Nursing Flow Sheet earlier today, 19cm x 7cm x 0.2cm ?Wound bed: red, moist with dry periphery ?Drainage (amount, consistency, odor) Scant serous ?Periwound: dry, peeling ?Dressing procedure/placement/frequency: Guidance for topical care sought and is provided using a topical antimicrobial nonadherent gauze dressing (xeroform) after cleansing of the wound and surrounding tissues. The xeroform is to be topped with a dry ABD for comfort and secured with Kerlix roll gauze and paper tape. Change daily. Float heel with the use of a Prevalon pressure redistribution heel boot. A sacral foam is to be applied for PI prevention. ? ?If desired, further evaluation, assessment and interventions could be sought from either Vascular surgery or Orthopedics. ? ?WOC nursing team will not follow, but will remain available to this patient, the nursing and medical teams.  Please re-consult if needed. ?Thanks, ?Ladona Mow, MSN, RN, GNP, CWOCN, CWON-AP, FAAN  ?Pager# [phone]  ? ? ? ?  ?

## 2021-06-08 NOTE — Progress Notes (Signed)
Initial Nutrition Assessment ? ?DOCUMENTATION CODES:  ? ?Not applicable ? ?INTERVENTION:  ? ?Multivitamin w/ minerals daily ?1 packet Juven BID, each packet provides 95 calories, 2.5 grams of protein (collagen), and 9.8 grams of carbohydrate (3 grams sugar); also contains 7 grams of L-arginine and L-glutamine, 300 mg vitamin C, 15 mg vitamin E, 1.2 mcg vitamin B-12, 9.5 mg zinc, 200 mg calcium, and 1.5 g  Calcium Beta-hydroxy-Beta-methylbutyrate to support wound healing ?Liberalize pt diet to regular due to increased nutrient needs and current diet being restrictive.  ?Encourage good PO intake ? ?NUTRITION DIAGNOSIS:  ? ?Increased nutrient needs related to wound healing as evidenced by estimated needs. ? ?GOAL:  ? ?Patient will meet greater than or equal to 90% of their needs ? ?MONITOR:  ? ?PO intake, Supplement acceptance, Skin, Labs ? ?REASON FOR ASSESSMENT:  ? ?Malnutrition Screening Tool ?  ? ?ASSESSMENT:  ? ?71 y.o. male presented to the ED with LLE pain and swelling. PMH includes R AKA, HTN, and chronic Hepatitis C. Pt admitted with cellulitis of LLE.  ? ?Pt noted to be Spanish speaking. RD working remotely at this time, will obtain nutrition related history at follow-up.  ? ?Per EMR, pt ate 75% of his dinner on 5/13.  ? ?Per EMR, pt has had a 5% weight loss within the past 3 months, this is not clinically significant for time frame.  ? ?RD to order pt supplements to provide additional calories and protein to promote wound healing.  ? ? ?Medications reviewed and include: IV antibiotics  ?Labs reviewed. ? ?NUTRITION - FOCUSED PHYSICAL EXAM: ? ?Deferred to follow-up.  ? ?Diet Order:   ?Diet Order   ? ?       ?  Diet Heart Room service appropriate? Yes; Fluid consistency: Thin  Diet effective now       ?  ? ?  ?  ? ?  ? ? ?EDUCATION NEEDS:  ? ?No education needs have been identified at this time ? ?Skin:  Skin Assessment: Skin Integrity Issues: ?Skin Integrity Issues:: Other (Comment) ?Other: Cellulitis (appears  degloved) ? ?Last BM:  5/11 ? ?Height:  ? ?Ht Readings from Last 1 Encounters:  ?06/07/21 5\' 8"  (1.727 m)  ? ? ?Weight:  ? ?Wt Readings from Last 1 Encounters:  ?06/07/21 64.5 kg  ? ?BMI:  Body mass index is 21.61 kg/m?06/09/21 ?Adjusted for AKA: 23.9 kg/m2 ? ?Estimated Nutritional Needs:  ? ?Kcal:  2000-2200 ? ?Protein:  100-115 grams ? ?Fluid:  >/= 2 L ? ? ? ?Marland Kitchen RD, LDN ?Clinical Dietitian ?See AMiON for contact information.  ? ?

## 2021-06-08 NOTE — Progress Notes (Signed)
Pharmacy Antibiotic Note ? ?Jerry Bradley is a 71 y.o. male admitted on 06/05/2021 with cellulitis of left lower extremity.  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 Pharmacy has been consulted for Vancomycin and Cefepime dosing.  Patient received Vanomycin 1gm IV and Ceftriaxone 1gm IV x 1 dose in ED. ? ?06/08/2021 ?Day #4 antibiotcs ?Afebrile, WBC WNL, cultures negative to date ? ?Plan: ?Cefepime 2gm IV q8h ?Vancomycin 750 mg IV Q 12 hrs. Goal AUC 400-550.  Expected AUC: 505  SCr used: 1 ??transition to oral antibiotics ? ?Height: 5\' 8"  (172.7 cm) ?Weight: 64.5 kg (142 lb 1.6 oz) ?IBW/kg (Calculated) : 68.4 ? ?Temp (24hrs), Avg:97.8 ?F (36.6 ?C), Min:97.7 ?F (36.5 ?C), Max:97.8 ?F (36.6 ?C) ? ?Recent Labs  ?Lab 06/05/21 ?1408 06/05/21 ?2305 06/06/21 ?[id number] 06/07/21 ?[id number]  ?WBC 13.4*  --  11.4* 8.9  ?CREATININE 1.00 0.79 0.76 0.72  ?LATICACIDVEN  --   --  0.8  --   ? ?  ?Estimated Creatinine Clearance: 80.6 mL/min (by C-G formula based on SCr of 0.72 mg/dL).   ? ?No Known Allergies ? ?Antimicrobials this admission: ?5/11 Ceftriaxone x 1 ?5/11 Cefepime >>   ?5/11 Vancomycin >> ? ?Dose adjustments this admission: ?  ?Microbiology results: ?5/11 BCx:  ngtd ?5/11 MRSA negative ? ?Thank you for allowing pharmacy to be a part of this patient?s care. ? ?Eudelia Bunch, Pharm.D ?06/08/2021 11:46 AM ? ?
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
## 2021-06-09 ENCOUNTER — Other Ambulatory Visit (HOSPITAL_COMMUNITY): Payer: Self-pay

## 2021-06-09 ENCOUNTER — Encounter: Payer: Self-pay | Admitting: Family Medicine

## 2021-06-09 LAB — COMPREHENSIVE METABOLIC PANEL
ALT: 21 U/L (ref 0–44)
AST: 24 U/L (ref 15–41)
Albumin: 2.5 g/dL — ABNORMAL LOW (ref 3.5–5.0)
Alkaline Phosphatase: 45 U/L (ref 38–126)
Anion gap: 7 (ref 5–15)
BUN: 14 mg/dL (ref 8–23)
CO2: 22 mmol/L (ref 22–32)
Calcium: 8.3 mg/dL — ABNORMAL LOW (ref 8.9–10.3)
Chloride: 105 mmol/L (ref 98–111)
Creatinine, Ser: 0.77 mg/dL (ref 0.61–1.24)
GFR, Estimated: >60 mL/min (ref >60)
Glucose, Bld: 106 mg/dL — ABNORMAL HIGH (ref 70–99)
Potassium: 4 mmol/L (ref 3.5–5.1)
Sodium: 134 mmol/L — ABNORMAL LOW (ref 135–145)
Total Bilirubin: 0.5 mg/dL (ref 0.3–1.2)
Total Protein: 6.9 g/dL (ref 6.5–8.1)

## 2021-06-09 LAB — CBC WITH DIFFERENTIAL/PLATELET
Abs Immature Granulocytes: 0.47 10*3/uL — ABNORMAL HIGH (ref 0.00–0.07)
Basophils Absolute: 0.1 10*3/uL (ref 0.0–0.1)
Basophils Relative: 1 %
Eosinophils Absolute: 0.3 10*3/uL (ref 0.0–0.5)
Eosinophils Relative: 3 %
HCT: 32.6 % — ABNORMAL LOW (ref 39.0–52.0)
Hemoglobin: 10.6 g/dL — ABNORMAL LOW (ref 13.0–17.0)
Immature Granulocytes: 4 %
Lymphocytes Relative: 11 %
Lymphs Abs: 1.2 10*3/uL (ref 0.7–4.0)
MCH: 29.1 pg (ref 26.0–34.0)
MCHC: 32.5 g/dL (ref 30.0–36.0)
MCV: 89.6 fL (ref 80.0–100.0)
Monocytes Absolute: 1 10*3/uL (ref 0.1–1.0)
Monocytes Relative: 9 %
Neutro Abs: 7.9 10*3/uL — ABNORMAL HIGH (ref 1.7–7.7)
Neutrophils Relative %: 72 %
Platelets: 700 10*3/uL — ABNORMAL HIGH (ref 150–400)
RBC: 3.64 MIL/uL — ABNORMAL LOW (ref 4.22–5.81)
RDW: 12.4 % (ref 11.5–15.5)
WBC: 11 10*3/uL — ABNORMAL HIGH (ref 4.0–10.5)
nRBC: 0 % (ref 0.0–0.2)

## 2021-06-09 MED ORDER — HYDROCODONE-ACETAMINOPHEN 5-325 MG PO TABS
1.0000 | ORAL_TABLET | ORAL | 0 refills | Status: DC | PRN
  Filled 2021-06-09: qty 30, 3d supply, fill #0

## 2021-06-09 MED ORDER — XEROFORM OCCLUSIVE GAUZE PATCH EX PADS
1.0000 | MEDICATED_PAD | CUTANEOUS | 0 refills | Status: DC
  Filled 2021-06-09: qty 6, fill #0

## 2021-06-09 MED ORDER — DOXYCYCLINE HYCLATE 100 MG PO TABS
100.0000 mg | ORAL_TABLET | Freq: Two times a day (BID) | ORAL | 0 refills | Status: AC
  Filled 2021-06-09: qty 10, 5d supply, fill #0
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
## 2021-06-09 NOTE — Progress Notes (Signed)
Orthopedic Tech Progress Note ?Patient Details:  ?Jerry Bradley ?1950-09-27 ?[id number] ? ?Ortho Devices ?Type of Ortho Device: Crutches ?Ortho Device/Splint Interventions: Application ?  ?Post Interventions ?Patient Tolerated: Well, Ambulated well ?Instructions Provided: Care of device, Adjustment of device ? ?Saul Fordyce ?06/09/2021, 3:40 PM ? ?
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
## 2021-06-09 NOTE — Evaluation (Signed)
Physical Therapy Evaluation ?Patient Details ?Name: Jerry Bradley ?MRN: [id number] ?DOB: 08/17/1950 ?Today's Date: 06/09/2021 ? ?History of Present Illness ? [facility] is a 71yo [facility] with complaint of LLE swelling with drainage, suggestive for cellulitis. Spanish-speaking.   PMH: HTN, asthma, R-transfemoral amputation. ?  ?Clinical Impression ? Pt presents with the problems listed above and functional impairments below. Purpose of today's session was to evaluate gait and determine benefit of bilateral crutches. Pt was modified independent for bed mobility and transfers and demonstrated donning his RLE prosthesis, using the wall during standing to help seat the prosthetic with supervision only. Pt demonstrated gait with SPC as has been his habit PTA. Instructed pt on safe crutch use, pt reported he used crutches for many years. Pt demonstrated safe 4-point gait pattern and reported decreased pain with ambulation and weightbearing. Pt reporting some uncertainty in housing and level of assistance post-discharge, MD notified, TOC team aware. No further acute PT needs identified. All education has been completed and the patient has no further questions, pt feels other than his wound he is at his baseline. PT is signing off, please re-consult should needs change. Thank you for this referral. ?   ?   ? ?Recommendations for follow up therapy are one component of a multi-disciplinary discharge planning process, led by the attending physician.  Recommendations may be updated based on patient status, additional functional criteria and insurance authorization. ? ?Follow Up Recommendations No PT follow up ? ?  ?Assistance Recommended at Discharge Set up Supervision/Assistance  ?Patient can return home with the following ? A little help with walking and/or transfers;A little help with bathing/dressing/bathroom;Assistance with cooking/housework;Assist for transportation;Help with stairs or ramp for  entrance ? ?  ?Equipment Recommendations Crutches  ?Recommendations for Other Services ?    ?  ?Functional Status Assessment Patient has had a recent decline in their functional status and demonstrates the ability to make significant improvements in function in a reasonable and predictable amount of time.  ? ?  ?Precautions / Restrictions Restrictions ?Weight Bearing Restrictions: No  ? ?  ? ?Mobility ? Bed Mobility ?Overal bed mobility: Modified Independent ?  ?  ?  ?  ?  ?  ?  ?  ? ?Transfers ?Overall transfer level: Modified independent ?Equipment used: None ?  ?  ?  ?  ?  ?  ?  ?General transfer comment: Increased time, pt used bed rail to assist in coming to upright standing. ?  ? ?Ambulation/Gait ?Ambulation/Gait assistance: Supervision ?Gait Distance (Feet): 40 Feet ?Assistive device: Crutches, Straight cane ?Gait Pattern/deviations: Step-through pattern, Decreased weight shift to right ?Gait velocity: decreased ?  ?  ?General Gait Details: Pt demonstrated typical ambulation using SPC, decreased stance time on RLE, with knee extension "kick" to lock RLE prosthesis into extension, trunk shifting to L to allow for circumduction of RLE. Pt trialed used of bilateral crutches and demonstrated step-through 4-point gait with improved reciprocal pattern, though continued to shift weight to L likely secondary to prosthesis being too long. ? ?Stairs ?  ?  ?  ?  ?  ? ?Wheelchair Mobility ?  ? ?Modified Rankin (Stroke Patients Only) ?  ? ?  ? ?Balance Overall balance assessment: Needs assistance ?Sitting-balance support: Feet supported, No upper extremity supported ?Sitting balance-Leahy Scale: Good ?  ?  ?Standing balance support: Single extremity supported, Bilateral upper extremity supported, During functional activity ?Standing balance-Leahy Scale: Fair ?Standing balance comment: Pt able to stand without UE  support, required use of wall to seat prosthetic. ?  ?  ?  ?  ?  ?  ?  ?  ?  ?  ?  ?   ? ? ? ?Pertinent  Vitals/Pain Pain Assessment ?Pain Assessment: 0-10 ?Pain Score: 6  ?Pain Location: left shin and foot ?Pain Descriptors / Indicators: Discomfort, Grimacing ?Pain Intervention(s): Monitored during session, Repositioned  ? ? ?Home Living Family/patient expects to be discharged to:: Unsure (Pt expressing concern about losing rental situation as landlord wants to evict.) ?Living Arrangements: Alone (Girlfriend lives in [place]) ?Available Help at Discharge: Friend(s);Available PRN/intermittently ?Type of Home: House (landlord, married couple with 12yo child) ?Home Access: Stairs to enter ?Entrance Stairs-Rails: Can reach both;Left;Right ?Entrance Stairs-Number of Steps: 3 ?  ?Home Layout: One level ?Home Equipment: Gilmer Mor - single point ?   ?  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  ?  ?  ?  ?  ?  ?Mobility Comments: uses SPC and prosethetic ?ADLs Comments: ind, pt able to shower himself ?  ? ? ?Hand Dominance  ?   ? ?  ?Extremity/Trunk Assessment  ? Upper Extremity Assessment ?Upper Extremity Assessment: Overall WFL for tasks assessed ?RUE Deficits / Details: Gross MMT 5/5, functional ROM ?RUE Sensation: WNL ?LUE Deficits / Details: Gross MMT 5/5, functional ROM ?LUE Sensation: WNL ?  ? ?Lower Extremity Assessment ?Lower Extremity Assessment: RLE deficits/detail;LLE deficits/detail ?RLE Deficits / Details: pt s/p R-Transfemoral amputation, able to don prosethesia without assistance ?LLE Deficits / Details: Pt with left shin wound covered in bandage, testing deferred ?LLE Sensation: WNL (reporting pain) ?  ? ?Cervical / Trunk Assessment ?Cervical / Trunk Assessment: Normal  ?Communication  ? Communication: Prefers language other than English (Spanish, Teacher, adult education provided translation)  ?Cognition Arousal/Alertness: Awake/alert ?Behavior During Therapy: [facility] for tasks assessed/performed ?Overall Cognitive Status:  Within Functional Limits for tasks assessed ?  ?  ?  ?  ?  ?  ?  ?  ?  ?  ?  ?  ?  ?  ?  ?  ?  ?  ?  ? ?  ?General Comments   ? ?  ?Exercises    ? ?Assessment/Plan  ?  ?PT Assessment Patient does not need any further PT services  ?PT Problem List Decreased strength;Decreased range of motion;Decreased activity tolerance;Decreased balance;Decreased mobility;Decreased coordination;Pain ? ?   ?  ?PT Treatment Interventions DME instruction;Gait training;Stair training;Therapeutic activities;Functional mobility training;Therapeutic exercise;Balance training;Cognitive remediation   ? ?PT Goals (Current goals can be found in the Care Plan section)  ?Acute Rehab PT Goals ?Patient Stated Goal: To get back to work ?PT Goal Formulation: With patient ? ?  ?Frequency   ?  ? ? ?Co-evaluation   ?  ?  ?  ?  ? ? ?  ?AM-PAC PT "6 Clicks" Mobility  ?Outcome Measure Help needed turning from your back to your side while in a flat bed without using bedrails?: None ?Help needed moving from lying on your back to sitting on the side of a flat bed without using bedrails?: None ?Help needed moving to and from a bed to a chair (including a wheelchair)?: A Little ?Help needed standing up from a chair using your arms (e.g., wheelchair or bedside chair)?: A Little ?Help needed to walk in hospital room?: A Little ?Help needed climbing 3-5 steps with a railing? : A Little ?6 Click Score: 20 ? ?  ?End of Session   ?  Activity Tolerance: Patient tolerated treatment well ?Patient left: in chair;with call bell/phone within reach ?Nurse Communication: Mobility status ?PT Visit Diagnosis: Difficulty in walking, not elsewhere classified (R26.2) ?  ? ?Time: 9476-5465 ?PT Time Calculation (min) (ACUTE ONLY): 55 min ? ? ?Charges:   PT Evaluation ?$PT Eval Low Complexity: 1 Low ?PT Treatments ?$Gait Training: 38-52 mins ?  ?   ? ? ?Jamesetta Geralds, PT, DPT ?[facility] Department ?Office: [phone] ?Pager: [phone] ? ?Jamesetta Geralds ?06/09/2021,  3:52 PM ? ?

## 2021-06-09 NOTE — TOC Transition Note (Signed)
Transition of Care (TOC) - CM/SW Discharge Note ? ?Patient Details  ?Name: Beaumont Whitenight ?MRN: [id number] ?Date of Birth: Apr 22, 1950 ? ?Transition of Care (TOC) CM/SW Contact:  ?Sherie Don, LCSW ?Phone Number: ?06/09/2021, 2:51 PM ? ?Clinical Narrative: Patient is experiencing housing issues. Shelter resources added to AVS. TOC signing off. ? ?Final next level of care: Home/Self Care ?Barriers to Discharge: Inadequate or no insurance ? ?Patient Goals and CMS Choice ?Choice offered to / list presented to : NA ? ?Discharge Plan and Services         ?DME Arranged: N/A ?DME Agency: NA ? ?Readmission Risk Interventions ?   ? View : No data to display.  ?  ?  ?  ? ?

## 2021-06-10 ENCOUNTER — Other Ambulatory Visit (HOSPITAL_COMMUNITY): Payer: Self-pay

## 2021-06-10 NOTE — TOC Transition Note (Signed)
Transition of Care (TOC) - CM/SW Discharge Note ? ? ?Patient Details  ?Name: Jerry Bradley ?MRN: [id number] ?Date of Birth: 1950-07-12 ? ?Transition of Care (TOC) CM/SW Contact:  ?Ross Ludwig, LCSW ?Phone Number: ?06/10/2021, 12:27 PM ? ? ?Clinical Narrative:    ? ?Patient was able to make arrangements with family and friends.  Patient is discharging with them.  TOC signing off please reconsult if other TOC needs arise. ? ? ?Final next level of care: Home/Self Care ?Barriers to Discharge: Barriers Resolved ? ? ?Patient Goals and CMS Choice ?Patient states their goals for this hospitalization and ongoing recovery are:: To return home with friends. ?  ?Choice offered to / list presented to : NA ? ?Discharge Placement ?  ?           ?  ?  ?  ?  ? ?Discharge Plan and Services ?  ?  ?           ?DME Arranged: N/A ?DME Agency: NA ?  ?  ?  ?  ?  ?  ?  ?  ? ?Social Determinants of Health (SDOH) Interventions ?  ? ? ?Readmission Risk Interventions ?   ? View : No data to display.  ?  ?  ?  ? ? ? ? ? ?

## 2021-06-10 NOTE — Progress Notes (Signed)
Long discussion with patient at the bedside-he is trying to arrange a hotel and will need a bus pass-I have spoke to social worker-he has his meds that will come to the bedside including his pain meds-I have asked that nursing ambulate the patient to ensure that he is stable on his feet-he has a letter for work and can discharge once we can coordinate the social aspects of his discharge ? ?He is stable from my perspective to discharge today ? ? ?25 minutes additional care coordination time ? ?Pleas Koch, MD ?[facility] ?10:29 AM ? ?

## 2021-06-10 NOTE — Progress Notes (Signed)
Patient discharging with friends. Home medications returned & Belongings returned to patient. Education on medications will be provided.  ?

## 2021-06-11 ENCOUNTER — Telehealth: Payer: Self-pay

## 2021-06-11 LAB — CULTURE, BLOOD (ROUTINE X 2)
Culture: NO GROWTH
Culture: NO GROWTH
Special Requests: ADEQUATE

## 2021-06-11 NOTE — Telephone Encounter (Signed)
Transition Care Management Unsuccessful Follow-up Telephone Call ? ?Call completed with assistance of [place], [place] # [id number] [employer] ?Date of discharge and from where:  06/10/2021< [facility]  ? ?Attempts:  1st Attempt ? ?Reason for unsuccessful TCM follow-up call:  Left voice message # [phone], call back requested. ? ? ? ?

## 2021-06-12 ENCOUNTER — Telehealth: Payer: Self-pay

## 2021-06-12 NOTE — Telephone Encounter (Addendum)
Transition Care Management Follow-up Telephone Call  Call completed with assistance of Spanish Interpreter # [id number]/[employer] Date of discharge and from where: 06/10/2021, Jerry Bradley How have you been since you were released from the hospital? He said he feels a little better but his wound still hurts.  Any questions or concerns? Yes- he has been out of work and is requesting a letter from the provider stating when/ why he was in the hospital and why he is not able to work at this time. He works as a Astronomer at [employer], standing all day. He also had questions about being paid while out of work and I instructed him to speak to his employer.   Items Reviewed: Did the pt receive and understand the discharge instructions provided? Yes  Medications obtained and verified? Yes  Other? No  Any new allergies since your discharge? No  Dietary orders reviewed? No Do you have support at home?  Lives alone  Home Care and Equipment/Supplies: Were home health services ordered? no If so, what is the name of the agency? N/a  Has the agency set up a time to come to the patient's home? no Were any new equipment or medical supplies ordered?  Yes: dressing supplies What is the name of the medical supply agency? He just said that he has them and is doing his own wound care.   Were you able to get the supplies/equipment? yes Do you have any questions related to the use of the equipment or supplies? No  Functional Questionnaire: (I = Independent and D = Dependent) ADLs: independent with ADLs. Has RLE prosthesis. He said he uses a cane but is trying to rest as much as possible, otherwise his shin will hurt.   Follow up appointments reviewed:  PCP Hospital f/u appt confirmed? Yes  Scheduled to see Dr Joya Gaskins- 07/02/2021. He didn't want to reschedule to be seen sooner. The appointment date, time and clinic address/phone number were text to the patient as he requested [facility] f/u  appt confirmed?  None scheduled at this time   Are transportation arrangements needed? No - he takes the bus If their condition worsens, is the pt aware to call PCP or go to the Emergency Dept.? Yes Was the patient provided with contact information for the PCP's office or ED? Yes Was to pt encouraged to call back with questions or concerns? Yes

## 2021-06-24 ENCOUNTER — Other Ambulatory Visit: Payer: Self-pay

## 2021-06-30 ENCOUNTER — Other Ambulatory Visit: Payer: Self-pay

## 2021-07-02 ENCOUNTER — Other Ambulatory Visit (HOSPITAL_COMMUNITY): Payer: Self-pay

## 2021-07-02 ENCOUNTER — Other Ambulatory Visit: Payer: Self-pay

## 2021-07-02 ENCOUNTER — Ambulatory Visit: Payer: Self-pay | Attending: Critical Care Medicine | Admitting: Critical Care Medicine

## 2021-07-02 ENCOUNTER — Encounter: Payer: Self-pay | Admitting: Critical Care Medicine

## 2021-07-02 ENCOUNTER — Other Ambulatory Visit: Payer: Self-pay | Admitting: Critical Care Medicine

## 2021-07-02 VITALS — BP 152/75 | HR 56 | Temp 98.1°F | Resp 16 | Wt 139.6 lb

## 2021-07-02 DIAGNOSIS — J4540 Moderate persistent asthma, uncomplicated: Secondary | ICD-10-CM

## 2021-07-02 DIAGNOSIS — S81802D Unspecified open wound, left lower leg, subsequent encounter: Secondary | ICD-10-CM

## 2021-07-02 DIAGNOSIS — Z139 Encounter for screening, unspecified: Secondary | ICD-10-CM

## 2021-07-02 DIAGNOSIS — A419 Sepsis, unspecified organism: Secondary | ICD-10-CM

## 2021-07-02 DIAGNOSIS — L03116 Cellulitis of left lower limb: Secondary | ICD-10-CM

## 2021-07-02 DIAGNOSIS — B182 Chronic viral hepatitis C: Secondary | ICD-10-CM

## 2021-07-02 DIAGNOSIS — I10 Essential (primary) hypertension: Secondary | ICD-10-CM

## 2021-07-02 DIAGNOSIS — Z89611 Acquired absence of right leg above knee: Secondary | ICD-10-CM

## 2021-07-02 MED ORDER — SULFAMETHOXAZOLE-TRIMETHOPRIM 800-160 MG PO TABS
1.0000 | ORAL_TABLET | Freq: Two times a day (BID) | ORAL | 0 refills | Status: AC
  Filled 2021-07-02 (×2): qty 20, 10d supply, fill #0

## 2021-07-02 MED ORDER — XEROFORM OCCLUSIVE GAUZE PATCH EX PADS
1.0000 | MEDICATED_PAD | CUTANEOUS | 1 refills | Status: DC
  Filled 2021-07-02: qty 25, fill #0

## 2021-07-02 MED ORDER — FLUTICASONE FUROATE-VILANTEROL 100-25 MCG/ACT IN AEPB
1.0000 | INHALATION_SPRAY | Freq: Every day | RESPIRATORY_TRACT | 2 refills | Status: DC
  Filled 2021-07-02: qty 60, 30d supply, fill #0
  Filled 2021-07-02: qty 60, 60d supply, fill #0
  Filled 2021-07-08: qty 180, 90d supply, fill #0

## 2021-07-02 MED ORDER — AMLODIPINE BESYLATE 10 MG PO TABS
10.0000 mg | ORAL_TABLET | Freq: Every day | ORAL | 2 refills | Status: DC
  Filled 2021-07-02: qty 30, 30d supply, fill #0
  Filled 2021-07-02: qty 90, 90d supply, fill #0
  Filled 2021-08-05: qty 30, 30d supply, fill #1
  Filled 2021-09-08: qty 30, 30d supply, fill #2

## 2021-07-02 MED ORDER — HYDROCODONE-ACETAMINOPHEN 5-325 MG PO TABS
1.0000 | ORAL_TABLET | ORAL | 0 refills | Status: DC | PRN
  Filled 2021-07-02: qty 30, 3d supply, fill #0

## 2021-07-02 NOTE — Assessment & Plan Note (Signed)
Referral sent to Hepatitis Clinic. Labs drawn today to check Liver Function.

## 2021-07-02 NOTE — Patient Instructions (Signed)
Resume amlodipine 1 daily for blood pressure  Begin Bactrim double strength twice daily and antibiotic for 10 days  Wound care supplies were sent to [facility] outpatient pharmacy please pick those up today to apply and change the dressings daily to the leg, we applied a dressing here at the clinic until you get the new dressings  Refill on your Breo inhaler was sent to the pharmacy  Referral to hepatitis C clinic was made  Referral to the wound care center was made  Return to see Dr. Delford Field 1 month  Labs will be obtained today  Pain medication refilled  Reanudar amlodipino 1 da para la presin arterial  Comience con Bactrim de doble concentracin dos veces al da y antibitico durante 10 das  Se enviaron suministros para el cuidado de heridas a la farmacia para pacientes ambulatorios de [place]. Recjalos hoy para aplicarlos y [facility] apsitos diariamente en la pierna. Aplicamos un apsito aqu en la clnica hasta que [employer] apsitos nuevos.  La recarga de su inhalador Breo fue enviada a la farmacia  Se hizo la derivacin a la clnica de hepatitis C.  Se realiz la derivacin al centro de atencin de heridas.  Volver a ver al Dr. Delford Field 1 mes  Los laboratorios se obtendrn hoy.  Medicamentos para el dolor reabastecidos

## 2021-07-02 NOTE — Assessment & Plan Note (Addendum)
Asthma well controlled.  Refill of breo sent to pharmacy. Continue Albuterol as needed.
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
## 2021-07-02 NOTE — Assessment & Plan Note (Addendum)
BP uncontrolled at 152/75 today. Patient ran out of his medications.  Refilled amlodipine 10 mg daily. Continue to take Daily.

## 2021-07-02 NOTE — Assessment & Plan Note (Addendum)
Patients condition has worsened since hospitalization. Wound is erythematous with purulent discharge. Concern for Sepsis and osteomyelitis if not controlled.   Bactrim prescribed and instructed  to take two times a day for 10 days. Referral to Wound Care Clinic. Return precautions given.   Non stick guaze applied to wound with holding tape  Urgent wound care referral  Wound care supplies sent to [facility] Short term f/u with Dr Delford Field

## 2021-07-03 LAB — COMPREHENSIVE METABOLIC PANEL
ALT: 28 IU/L (ref 0–44)
AST: 35 IU/L (ref 0–40)
Albumin/Globulin Ratio: 1.1 — ABNORMAL LOW (ref 1.2–2.2)
Albumin: 4.1 g/dL (ref 3.8–4.8)
Alkaline Phosphatase: 63 IU/L (ref 44–121)
BUN/Creatinine Ratio: 18 (ref 10–24)
BUN: 13 mg/dL (ref 8–27)
Bilirubin Total: 0.2 mg/dL (ref 0.0–1.2)
CO2: 21 mmol/L (ref 20–29)
Calcium: 9.1 mg/dL (ref 8.6–10.2)
Chloride: 102 mmol/L (ref 96–106)
Creatinine, Ser: 0.74 mg/dL — ABNORMAL LOW (ref 0.76–1.27)
Globulin, Total: 3.6 g/dL (ref 1.5–4.5)
Glucose: 88 mg/dL (ref 70–99)
Potassium: 4.5 mmol/L (ref 3.5–5.2)
Sodium: 136 mmol/L (ref 134–144)
Total Protein: 7.7 g/dL (ref 6.0–8.5)
eGFR: 99 mL/min/{1.73_m2} (ref >59)

## 2021-07-03 LAB — CBC WITH DIFFERENTIAL/PLATELET
Basophils Absolute: 0.2 10*3/uL (ref 0.0–0.2)
Basos: 2 %
EOS (ABSOLUTE): 0.8 10*3/uL — ABNORMAL HIGH (ref 0.0–0.4)
Eos: 7 %
Hematocrit: 34.5 % — ABNORMAL LOW (ref 37.5–51.0)
Hemoglobin: 11.4 g/dL — ABNORMAL LOW (ref 13.0–17.7)
Immature Grans (Abs): 0.1 10*3/uL (ref 0.0–0.1)
Immature Granulocytes: 1 %
Lymphocytes Absolute: 1.5 10*3/uL (ref 0.7–3.1)
Lymphs: 14 %
MCH: 28.1 pg (ref 26.6–33.0)
MCHC: 33 g/dL (ref 31.5–35.7)
MCV: 85 fL (ref 79–97)
Monocytes Absolute: 0.7 10*3/uL (ref 0.1–0.9)
Monocytes: 6 %
Neutrophils Absolute: 7.6 10*3/uL — ABNORMAL HIGH (ref 1.4–7.0)
Neutrophils: 70 %
Platelets: 551 10*3/uL — ABNORMAL HIGH (ref 150–450)
RBC: 4.06 x10E6/uL — ABNORMAL LOW (ref 4.14–5.80)
RDW: 12.7 % (ref 11.6–15.4)
WBC: 10.8 10*3/uL (ref 3.4–10.8)

## 2021-07-03 LAB — LIPID PANEL
Chol/HDL Ratio: 2.9 ratio (ref 0.0–5.0)
Cholesterol, Total: 136 mg/dL (ref 100–199)
HDL: 47 mg/dL (ref >39)
LDL Chol Calc (NIH): 74 mg/dL (ref 0–99)
Triglycerides: 76 mg/dL (ref 0–149)
VLDL Cholesterol Cal: 15 mg/dL (ref 5–40)

## 2021-07-03 NOTE — Progress Notes (Signed)
We tried to call the patient with no answer left a voicemail please call the patient tell him his labs are all normal and to follow-up with the wound care and take the antibiotics
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
## 2021-07-03 NOTE — Assessment & Plan Note (Signed)
This has resolved.

## 2021-07-04 ENCOUNTER — Telehealth: Payer: Self-pay

## 2021-07-04 NOTE — Telephone Encounter (Signed)
Pt was called and is aware of results, DOB was confirmed.   Interpreter PX#[id number]

## 2021-07-04 NOTE — Telephone Encounter (Signed)
-----   Message from Storm Frisk, MD sent at 07/03/2021  3:53 PM EDT ----- We tried to call the patient with no answer left a voicemail please call the patient tell him his labs are all normal and to follow-up with the wound care and take the antibiotics

## 2021-07-08 ENCOUNTER — Other Ambulatory Visit: Payer: Self-pay | Admitting: Critical Care Medicine

## 2021-07-08 ENCOUNTER — Other Ambulatory Visit: Payer: Self-pay

## 2021-07-08 MED ORDER — ALBUTEROL SULFATE HFA 108 (90 BASE) MCG/ACT IN AERS
INHALATION_SPRAY | RESPIRATORY_TRACT | 2 refills | Status: DC
  Filled 2021-07-08: qty 8.5, 17d supply, fill #0
  Filled 2021-08-05: qty 8.5, 16d supply, fill #0
  Filled 2021-09-08: qty 8.5, 16d supply, fill #1
  Filled 2021-09-24: qty 6.7, 17d supply, fill #2

## 2021-07-08 NOTE — Telephone Encounter (Signed)
Requested Prescriptions  Pending Prescriptions Disp Refills  . albuterol (VENTOLIN HFA) 108 (90 Base) MCG/ACT inhaler 8.5 g 2    Sig: Inhale 2 puffs into the lungs every 4 hours as needed for wheezing or shortness of breath     Pulmonology:  Beta Agonists 2 Failed - 07/08/2021 11:04 AM      Failed - Last BP in normal range    BP Readings from Last 1 Encounters:  07/02/21 (!) 152/75         Passed - Last Heart Rate in normal range    Pulse Readings from Last 1 Encounters:  07/02/21 (!) 56         Passed - Valid encounter within last 12 months    Recent Outpatient Visits          6 days ago Cellulitis of left lower extremity   [facility] Storm Frisk, MD   4 months ago HTN (hypertension), malignant   [facility] Storm Frisk, MD   7 months ago Respiratory distress   [facility] Storm Frisk, MD

## 2021-07-09 ENCOUNTER — Other Ambulatory Visit: Payer: Self-pay

## 2021-07-15 ENCOUNTER — Other Ambulatory Visit (HOSPITAL_COMMUNITY): Payer: Self-pay

## 2021-07-15 ENCOUNTER — Telehealth: Payer: Self-pay

## 2021-07-15 NOTE — Telephone Encounter (Signed)
[facility] Patient Advocate Encounter ? ?Insurance verification completed.   ? ?The patient is uninsured and will need patient assistance for medication. ? ?We can complete the application and will need to meet with the patient for signatures and income documentation. ? ?Irine Heminger, CPhT ?Specialty Pharmacy Patient Advocate ?[facility] ?Phone: [phone] ?Fax:  [phone]  ?

## 2021-07-16 ENCOUNTER — Other Ambulatory Visit: Payer: Self-pay

## 2021-07-16 ENCOUNTER — Ambulatory Visit (INDEPENDENT_AMBULATORY_CARE_PROVIDER_SITE_OTHER): Payer: Self-pay | Admitting: Infectious Diseases

## 2021-07-16 VITALS — BP 129/75 | HR 74 | Temp 97.7°F | Wt 139.0 lb

## 2021-07-16 DIAGNOSIS — L03116 Cellulitis of left lower limb: Secondary | ICD-10-CM

## 2021-07-16 DIAGNOSIS — B182 Chronic viral hepatitis C: Secondary | ICD-10-CM

## 2021-07-16 DIAGNOSIS — Z789 Other specified health status: Secondary | ICD-10-CM

## 2021-07-16 DIAGNOSIS — F1910 Other psychoactive substance abuse, uncomplicated: Secondary | ICD-10-CM | POA: Insufficient documentation

## 2021-07-16 DIAGNOSIS — Z23 Encounter for immunization: Secondary | ICD-10-CM | POA: Insufficient documentation

## 2021-07-16 DIAGNOSIS — F17200 Nicotine dependence, unspecified, uncomplicated: Secondary | ICD-10-CM | POA: Insufficient documentation

## 2021-07-16 DIAGNOSIS — Z87891 Personal history of nicotine dependence: Secondary | ICD-10-CM | POA: Insufficient documentation
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
## 2021-07-17 ENCOUNTER — Encounter (HOSPITAL_BASED_OUTPATIENT_CLINIC_OR_DEPARTMENT_OTHER): Payer: Self-pay | Attending: Internal Medicine | Admitting: Internal Medicine

## 2021-07-17 DIAGNOSIS — Z89611 Acquired absence of right leg above knee: Secondary | ICD-10-CM | POA: Insufficient documentation

## 2021-07-17 DIAGNOSIS — L97929 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 with unspecified severity: Secondary | ICD-10-CM | POA: Insufficient documentation

## 2021-07-17 DIAGNOSIS — B182 Chronic viral hepatitis C: Secondary | ICD-10-CM | POA: Insufficient documentation

## 2021-07-17 DIAGNOSIS — I87312 Chronic venous hypertension (idiopathic) with ulcer of left lower extremity: Secondary | ICD-10-CM

## 2021-07-17 DIAGNOSIS — F17210 Nicotine dependence, cigarettes, uncomplicated: Secondary | ICD-10-CM | POA: Insufficient documentation

## 2021-07-17 DIAGNOSIS — I87322 Chronic venous hypertension (idiopathic) with inflammation of left lower extremity: Secondary | ICD-10-CM | POA: Insufficient documentation
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
## 2021-07-17 NOTE — Progress Notes (Signed)
TELVIN, REINDERS ([id number]) Visit Report for 07/17/2021 Allergy List Details Patient Name: Date of Service: [name] 07/17/2021 9:45 A M Medical Record Number: [id number] Patient Account Number: [id number] Date of Birth/Sex: Treating RN: [birth year] (71 y.o. Hessie Diener Primary Care Cherika Jessie: Asencion Noble Other Clinician: Referring Leno Mathes: Treating Jasper Ruminski/Extender: Madelynn Done in Treatment: 0 Allergies Active Allergies No Known Drug Allergies Allergy Notes Electronic Signature(s) Signed: 07/17/2021 5:10:40 PM By: Deon Pilling RN, BSN Entered By: Deon Pilling on 07/16/2021 09:28:02 -------------------------------------------------------------------------------- Arrival Information Details Patient Name: Date of Service: [name], [name] 07/17/2021 9:45 A M Medical Record Number: [id number] Patient Account Number: [id number] Date of Birth/Sex: Treating RN: [birth year] (71 y.o. Hessie Diener Primary Care Narcissa Melder: Asencion Noble Other Clinician: Referring Rube Sanchez: Treating Sigmond Patalano/Extender: Madelynn Done in Treatment: 0 Visit Information Patient Arrived: Lyndel Pleasure Time: 09:52 Accompanied By: son Dominica Severin Transfer Assistance: None Patient Identification Verified: Yes Secondary Verification Process Completed: Yes Patient Requires Transmission-Based Precautions: No Patient Has Alerts: No Notes per son is patient's interpreter and per son has signed the consent waiver for interpreter with [facility]. Electronic Signature(s) Signed: 07/17/2021 5:10:40 PM By: Deon Pilling RN, BSN Entered By: Deon Pilling on 07/17/2021 09:54:26 -------------------------------------------------------------------------------- Clinic Level of Care Assessment Details Patient Name: Date of Service: [name] 07/17/2021 9:45 A M Medical Record Number: [id number] Patient Account Number: [id number] Date  of Birth/Sex: Treating RN: [birth year] (71 y.o. Hessie Diener Primary Care Imelda Dandridge: Asencion Noble Other Clinician: Referring Quindarius Cabello: Treating Zaheer Wageman/Extender: Madelynn Done in Treatment: 0 Clinic Level of Care Assessment Items TOOL 1 Quantity Score X- 1 0 Use when EandM and Procedure is performed on INITIAL visit ASSESSMENTS - Nursing Assessment / Reassessment X- 1 20 General Physical Exam (combine w/ comprehensive assessment (listed just below) when performed on new pt. evals) X- 1 25 Comprehensive Assessment (HX, ROS, Risk Assessments, Wounds Hx, etc.) ASSESSMENTS - Wound and Skin Assessment / Reassessment X- 1 10 Dermatologic / Skin Assessment (not related to wound area) ASSESSMENTS - Ostomy and/or Continence Assessment and Care []  - 0 Incontinence Assessment and Management []  - 0 Ostomy Care Assessment and Management (repouching, etc.) PROCESS - Coordination of Care []  - 0 Simple Patient / Family Education for ongoing care X- 1 20 Complex (extensive) Patient / Family Education for ongoing care X- 1 10 Staff obtains Programmer, systems, Records, T Results / Process Orders est []  - 0 Staff telephones HHA, Nursing Homes / Clarify orders / etc []  - 0 Routine Transfer to another Facility (non-emergent condition) []  - 0 Routine Hospital Admission (non-emergent condition) X- 1 15 New Admissions / Biomedical engineer / Ordering NPWT Apligraf, etc. , []  - 0 Emergency Hospital Admission (emergent condition) PROCESS - Special Needs []  - 0 Pediatric / Minor Patient Management []  - 0 Isolation Patient Management []  - 0 Hearing / Language / Visual special needs []  - 0 Assessment of Community assistance (transportation, D/C planning, etc.) []  - 0 Additional assistance / Altered mentation []  - 0 Support Surface(s) Assessment (bed, cushion, seat, etc.) INTERVENTIONS - Miscellaneous []  - 0 External ear exam []  - 0 Patient Transfer (multiple  staff / Civil Service fast streamer / Similar devices) []  - 0 Simple Staple / Suture removal (25 or less) []  - 0 Complex Staple / Suture removal (26 or more) []  - 0 Hypo/Hyperglycemic Management (do not check if billed separately) X- 1 15 Ankle / Brachial Index (ABI) - do not check  if billed separately Has the patient been seen at the hospital within the last three years: Yes Total Score: 115 Level Of Care: New/Established - Level 3 Electronic Signature(s) Signed: 07/17/2021 5:10:40 PM By: Deon Pilling RN, BSN Entered By: Deon Pilling on 07/17/2021 10:49:02 -------------------------------------------------------------------------------- Compression Therapy Details Patient Name: Date of Service: [name] 07/17/2021 9:45 A M Medical Record Number: [id number] Patient Account Number: [id number] Date of Birth/Sex: Treating RN: [birth year] (71 y.o. Hessie Diener Primary Care Ivyrose Hashman: Asencion Noble Other Clinician: Referring Eiman Maret: Treating Paola Flynt/Extender: Madelynn Done in Treatment: 0 Compression Therapy Performed for Wound Assessment: Wound #1 Left,Anterior Lower Leg Performed By: Clinician Deon Pilling, RN Compression Type: Three Layer Post Procedure Diagnosis Same as Pre-procedure Electronic Signature(s) Signed: 07/17/2021 5:10:40 PM By: Deon Pilling RN, BSN Entered By: Deon Pilling on 07/17/2021 10:47:45 -------------------------------------------------------------------------------- Encounter Discharge Information Details Patient Name: Date of Service: [name], [name] 07/17/2021 9:45 A M Medical Record Number: [id number] Patient Account Number: [id number] Date of Birth/Sex: Treating RN: [birth year] (71 y.o. Hessie Diener Primary Care Naoko Diperna: Asencion Noble Other Clinician: Referring Sylvanus Telford: Treating Chay Mazzoni/Extender: Madelynn Done in Treatment: 0 Encounter Discharge Information Items Post  Procedure Vitals Discharge Condition: Stable Temperature (F): 98.1 Ambulatory Status: Cane Pulse (bpm): 61 Discharge Destination: Home Respiratory Rate (breaths/min): 20 Transportation: Private Auto Blood Pressure (mmHg): 138/76 Accompanied By: son Schedule Follow-up Appointment: Yes Clinical Summary of Care: Electronic Signature(s) Signed: 07/17/2021 5:10:40 PM By: Deon Pilling RN, BSN Entered By: Deon Pilling on 07/17/2021 10:54:09 -------------------------------------------------------------------------------- Lower Extremity Assessment Details Patient Name: Date of Service: [name] 07/17/2021 9:45 A M Medical Record Number: [id number] Patient Account Number: [id number] Date of Birth/Sex: Treating RN: [birth year] (71 y.o. [name], Meta.Reding Primary Care Steve Youngberg: Asencion Noble Other Clinician: Referring Larenzo Caples: Treating Nasif Bos/Extender: Madelynn Done in Treatment: 0 Edema Assessment Assessed: [name]: Yes] [name]: No] Edema: [Left: Ye] [Right: s] Calf Left: Right: Point of Measurement: 34 cm From Medial Instep 36 cm Ankle Left: Right: Point of Measurement: 11 cm From Medial Instep 23 cm Knee To Floor Left: Right: From Medial Instep 46 cm Vascular Assessment Pulses: Dorsalis Pedis Palpable: [Left:Yes] Doppler Audible: [Left:Yes] Posterior Tibial Palpable: [Left:Yes] Doppler Audible: [Left:Yes] Blood Pressure: Brachial: [Left:138] Ankle: [Left:Dorsalis Pedis: 175 1.27] Electronic Signature(s) Signed: 07/17/2021 5:10:40 PM By: Deon Pilling RN, BSN Entered By: Deon Pilling on 07/17/2021 10:10:44 -------------------------------------------------------------------------------- Multi Wound Chart Details Patient Name: Date of Service: [name] 07/17/2021 9:45 A M Medical Record Number: [id number] Patient Account Number: [id number] Date of Birth/Sex: Treating RN: [birth year] (71 y.o. Hessie Diener Primary  Care Tiaira Arambula: Asencion Noble Other Clinician: Referring Colum Colt: Treating Casimer Russett/Extender: Madelynn Done in Treatment: 0 Vital Signs Height(in): 47 Pulse(bpm): 42 Weight(lbs): Blood Pressure(mmHg): 138/76 Body Mass Index(BMI): Temperature(F): 98.1 Respiratory Rate(breaths/min): 20 Photos: [N/A:N/A] Left, Anterior Lower Leg N/A N/A Wound Location: Gradually Appeared N/A N/A Wounding Event: Venous Leg Ulcer N/A N/A Primary Etiology: Asthma, Chronic Obstructive N/A N/A Comorbid History: Pulmonary Disease (COPD), Hypertension, Hepatitis C 05/26/2021 N/A N/A Date Acquired: 0 N/A N/A Weeks of Treatment: Open N/A N/A Wound Status: No N/A N/A Wound Recurrence: Yes N/A N/A Clustered Wound: 6 N/A N/A Clustered Quantity: 10.2x5.2x0.1 N/A N/A Measurements L x W x D (cm) 41.658 N/A N/A A (cm) : rea 4.166 N/A N/A Volume (cm) : 0.00% N/A N/A % Reduction in A rea: 0.00% N/A N/A % Reduction in Volume: Full Thickness Without Exposed  N/A N/A Classification: Support Structures Large N/A N/A Exudate A mount: Serosanguineous N/A N/A Exudate Type: red, brown N/A N/A Exudate Color: Distinct, outline attached N/A N/A Wound Margin: Small (1-33%) N/A N/A Granulation A mount: Pink N/A N/A Granulation Quality: Large (67-100%) N/A N/A Necrotic A mount: Fat Layer (Subcutaneous Tissue): Yes N/A N/A Exposed Structures: Fascia: No Tendon: No Muscle: No Joint: No Bone: No Small (1-33%) N/A N/A Epithelialization: Debridement - Selective/Open Wound N/A N/A Debridement: Pre-procedure Verification/Time Out 10:40 N/A N/A Taken: Lidocaine 5% topical ointment N/A N/A Pain Control: Slough N/A N/A Tissue Debrided: Non-Viable Tissue N/A N/A Level: 45.6 N/A N/A Debridement A (sq cm): rea Curette N/A N/A Instrument: Minimum N/A N/A Bleeding: Pressure N/A N/A Hemostasis A chieved: 0 N/A N/A Procedural Pain: 0 N/A N/A Post Procedural  Pain: Procedure was tolerated well N/A N/A Debridement Treatment Response: 10.2x5.2x0.1 N/A N/A Post Debridement Measurements L x W x D (cm) 4.166 N/A N/A Post Debridement Volume: (cm) maceration periwound. N/A N/A Assessment Notes: Compression Therapy N/A N/A Procedures Performed: Debridement Treatment Notes Wound #1 (Lower Leg) Wound Laterality: Left, Anterior Cleanser Soap and Water Discharge Instruction: May shower and wash wound with dial antibacterial soap and water prior to dressing change. Peri-Wound Care Triamcinolone 15 (g) Discharge Instruction: Use triamcinolone 15 (g) as directed Zinc Oxide Ointment 30g tube Discharge Instruction: Apply Zinc Oxide to periwound with each dressing change Sween Lotion (Moisturizing lotion) Discharge Instruction: Apply moisturizing lotion as directed Topical Gentamicin Discharge Instruction: As directed by physician Mupirocin Ointment Discharge Instruction: Apply Mupirocin (Bactroban) as instructed Triamcinolone Discharge Instruction: Apply Triamcinolone as directed Primary Dressing Hydrofera Blue Classic Foam, 4x4 in Discharge Instruction: Moisten with saline prior to applying to wound bed Secondary Dressing ABD Pad, 8x10 Discharge Instruction: Apply over primary dressing as directed. Woven Gauze Sponge, Non-Sterile 4x4 in Discharge Instruction: Apply over primary dressing as directed. Zetuvit Plus 4x8 in Discharge Instruction: Apply over primary dressing as directed. Secured With Compression Wrap ThreePress (3 layer compression wrap) Discharge Instruction: Apply three layer compression as directed. Compression Stockings Add-Ons Electronic Signature(s) Signed: 07/17/2021 11:57:22 AM By: Kalman Shan DO Signed: 07/17/2021 5:10:40 PM By: Deon Pilling RN, BSN Entered By: Kalman Shan on 07/17/2021 10:56:43 -------------------------------------------------------------------------------- Multi-Disciplinary Care Plan  Details Patient Name: Date of Service: [name], [name] 07/17/2021 9:45 A M Medical Record Number: [id number] Patient Account Number: [id number] Date of Birth/Sex: Treating RN: Jun 15, 1950 (71 y.o. [name], Tammi Klippel Primary Care Nakhi Choi: Asencion Noble Other Clinician: Referring Laelah Siravo: Treating Barrie Wale/Extender: Madelynn Done in Treatment: 0 Active Inactive Necrotic Tissue Nursing Diagnoses: Impaired tissue integrity related to necrotic/devitalized tissue Goals: Necrotic/devitalized tissue will be minimized in the wound bed Date Initiated: 07/17/2021 Target Resolution Date: 08/01/2021 Goal Status: Active Patient/caregiver will verbalize understanding of reason and process for debridement of necrotic tissue Date Initiated: 07/17/2021 Target Resolution Date: 08/01/2021 Goal Status: Active Interventions: Assess patient pain level pre-, during and post procedure and prior to discharge Treatment Activities: Apply topical anesthetic as ordered : 07/17/2021 Excisional debridement : 07/17/2021 Notes: Orientation to the Wound Care Program Nursing Diagnoses: Knowledge deficit related to the wound healing center program Goals: Patient/caregiver will verbalize understanding of the [place] Date Initiated: 07/17/2021 Target Resolution Date: 08/01/2021 Goal Status: Active Interventions: Provide education on orientation to the wound center Notes: Pain, Acute or Chronic Nursing Diagnoses: Pain, acute or chronic: actual or potential Potential alteration in comfort, pain Goals: Patient will verbalize adequate pain control and receive pain control interventions during procedures  as needed Date Initiated: 07/17/2021 Target Resolution Date: 07/31/2021 Goal Status: Active Patient/caregiver will verbalize comfort level met Date Initiated: 07/17/2021 Target Resolution Date: 08/01/2021 Goal Status: Active Interventions: Encourage patient to take  pain medications as prescribed Provide education on pain management Reposition patient for comfort Treatment Activities: Administer pain control measures as ordered : 07/17/2021 Notes: Wound/Skin Impairment Nursing Diagnoses: Knowledge deficit related to ulceration/compromised skin integrity Goals: Patient will demonstrate a reduced rate of smoking or cessation of smoking Date Initiated: 07/17/2021 Target Resolution Date: 08/01/2021 Goal Status: Active Patient/caregiver will verbalize understanding of skin care regimen Date Initiated: 07/17/2021 Target Resolution Date: 08/01/2021 Goal Status: Active Interventions: Assess patient/caregiver ability to perform ulcer/skin care regimen upon admission and as needed Assess ulceration(s) every visit Provide education on smoking Provide education on ulcer and skin care Treatment Activities: Skin care regimen initiated : 07/17/2021 Smoking cessation education : 07/17/2021 Topical wound management initiated : 07/17/2021 Notes: Electronic Signature(s) Signed: 07/17/2021 5:10:40 PM By: Deon Pilling RN, BSN Entered By: Deon Pilling on 07/17/2021 10:35:36 -------------------------------------------------------------------------------- Pain Assessment Details Patient Name: Date of Service: [name] 07/17/2021 9:45 A M Medical Record Number: [id number] Patient Account Number: [id number] Date of Birth/Sex: Treating RN: Aug 26, 1950 (71 y.o. Hessie Diener Primary Care Hodaya Curto: Asencion Noble Other Clinician: Referring Martavis Gurney: Treating Sevyn Paredez/Extender: Madelynn Done in Treatment: 0 Active Problems Location of Pain Severity and Description of Pain Patient Has Paino No Site Locations Pain Management and Medication Current Pain Management: Notes per patient discomfort at times. Electronic Signature(s) Signed: 07/17/2021 5:10:40 PM By: Deon Pilling RN, BSN Entered By: Deon Pilling on 07/17/2021  09:58:48 -------------------------------------------------------------------------------- Patient/Caregiver Education Details Patient Name: Date of Service: [name] 6/22/2023andnbsp9:45 A M Medical Record Number: [id number] Patient Account Number: [id number] Date of Birth/Gender: Treating RN: 09-27-1950 (71 y.o. Hessie Diener Primary Care Physician: Asencion Noble Other Clinician: Referring Physician: Treating Physician/Extender: Madelynn Done in Treatment: 0 Education Assessment Education Provided To: Patient and Caregiver son Education Topics Provided Smoking and Wound Healing: Handouts: Other: verbal Methods: Explain/Verbal Responses: Reinforcements needed [place]: o Handouts: Welcome T [facility] [place] Spanish o Methods: Explain/Verbal, Printed Responses: Reinforcements needed Wound Debridement: Handouts: Wound Debridement Spanish Methods: Printed Responses: Reinforcements needed Electronic Signature(s) Signed: 07/17/2021 5:10:40 PM By: Deon Pilling RN, BSN Entered By: Deon Pilling on 07/17/2021 10:36:07 -------------------------------------------------------------------------------- Wound Assessment Details Patient Name: Date of Service: [name] 07/17/2021 9:45 A M Medical Record Number: [id number] Patient Account Number: [id number] Date of Birth/Sex: Treating RN: 1950-06-23 (71 y.o. [name], Meta.Reding Primary Care Louna Rothgeb: Asencion Noble Other Clinician: Referring Edilberto Roosevelt: Treating Thayden Lemire/Extender: Madelynn Done in Treatment: 0 Wound Status Wound Number: 1 Primary Venous Leg Ulcer Etiology: Wound Location: Left, Anterior Lower Leg Wound Open Wounding Event: Gradually Appeared Status: Date Acquired: 05/26/2021 Comorbid Asthma, Chronic Obstructive Pulmonary Disease (COPD), Weeks Of Treatment: 0 History: Hypertension, Hepatitis C Clustered  Wound: Yes Photos Wound Measurements Length: (cm) 10.2 Width: (cm) 5.2 Depth: (cm) 0.1 Clustered Quantity: 6 Area: (cm) 41.658 Volume: (cm) 4.166 % Reduction in Area: 0% % Reduction in Volume: 0% Epithelialization: Small (1-33%) Tunneling: No Undermining: No Wound Description Classification: Full Thickness Without Exposed Support Structures Wound Margin: Distinct, outline attached Exudate Amount: Large Exudate Type: Serosanguineous Exudate Color: red, brown Foul Odor After Cleansing: No Slough/Fibrino Yes Wound Bed Granulation Amount: Small (1-33%) Exposed Structure Granulation Quality: Pink Fascia Exposed: No Necrotic Amount: Large (67-100%) Fat  Layer (Subcutaneous Tissue) Exposed: Yes Necrotic Quality: Adherent Slough Tendon Exposed: No Muscle Exposed: No Joint Exposed: No Bone Exposed: No Assessment Notes maceration periwound. Treatment Notes Wound #1 (Lower Leg) Wound Laterality: Left, Anterior Cleanser Soap and Water Discharge Instruction: May shower and wash wound with dial antibacterial soap and water prior to dressing change. Peri-Wound Care Triamcinolone 15 (g) Discharge Instruction: Use triamcinolone 15 (g) as directed Zinc Oxide Ointment 30g tube Discharge Instruction: Apply Zinc Oxide to periwound with each dressing change Sween Lotion (Moisturizing lotion) Discharge Instruction: Apply moisturizing lotion as directed Topical Gentamicin Discharge Instruction: As directed by physician Mupirocin Ointment Discharge Instruction: Apply Mupirocin (Bactroban) as instructed Triamcinolone Discharge Instruction: Apply Triamcinolone as directed Primary Dressing Hydrofera Blue Classic Foam, 4x4 in Discharge Instruction: Moisten with saline prior to applying to wound bed Secondary Dressing ABD Pad, 8x10 Discharge Instruction: Apply over primary dressing as directed. Woven Gauze Sponge, Non-Sterile 4x4 in Discharge Instruction: Apply over primary dressing  as directed. Zetuvit Plus 4x8 in Discharge Instruction: Apply over primary dressing as directed. Secured With Compression Wrap ThreePress (3 layer compression wrap) Discharge Instruction: Apply three layer compression as directed. Compression Stockings Add-Ons Electronic Signature(s) Signed: 07/17/2021 4:43:05 PM By: Lorrin Jackson Signed: 07/17/2021 5:10:40 PM By: Deon Pilling RN, BSN Entered By: Lorrin Jackson on 07/17/2021 10:14:43 -------------------------------------------------------------------------------- Vitals Details Patient Name: Date of Service: [name], [name] 07/17/2021 9:45 A M Medical Record Number: [id number] Patient Account Number: [id number] Date of Birth/Sex: Treating RN: 1950/04/03 (71 y.o. Hessie Diener Primary Care Gaylon Melchor: Asencion Noble Other Clinician: Referring Kashmere Daywalt: Treating Mordche Hedglin/Extender: Madelynn Done in Treatment: 0 Vital Signs Time Taken: 09:54 Temperature (F): 98.1 Height (in): 65 Pulse (bpm): 61 Source: Stated Respiratory Rate (breaths/min): 20 Source: Stated Blood Pressure (mmHg): 138/76 Reference Range: 80 - 120 mg / dl Electronic Signature(s) Signed: 07/17/2021 5:10:40 PM By: Deon Pilling RN, BSN Entered By: Deon Pilling on 07/17/2021 09:57:36

## 2021-07-17 NOTE — Progress Notes (Signed)
Jerry, Bradley ([id number]) Visit Report for 07/17/2021 Abuse Risk Screen Details Patient Name: Date of Service: Jerry Bradley UE 07/17/2021 9:45 A M Medical Record Number: [id number] Patient Account Number: [id number] Date of Birth/Sex: Treating RN: [birth year] (71 y.o. Jerry Bradley, Jerry Bradley Primary Care Jerry Bradley: Jerry Bradley Other Clinician: Referring Jerry Bradley: Treating Jerry Bradley/Extender: Jerry Bradley in Treatment: 0 Abuse Risk Screen Items Answer ABUSE RISK SCREEN: Has anyone close to you tried to hurt or harm you recentlyo No Do you feel uncomfortable with anyone in your familyo No Has anyone forced you do things that you didnt want to doo No Electronic Signature(s) Signed: 07/17/2021 5:10:40 PM By: Jerry Stall RN, BSN Entered By: Jerry Bradley on 07/17/2021 09:58:58 -------------------------------------------------------------------------------- Activities of Daily Living Details Patient Name: Date of Service: Jerry Bradley UE 07/17/2021 9:45 A M Medical Record Number: [id number] Patient Account Number: [id number] Date of Birth/Sex: Treating RN: 1950/11/21 (71 y.o. Jerry Bradley Primary Care Chasty Randal: Jerry Bradley Other Clinician: Referring Jerry Bradley: Treating Rainn Zupko/Extender: Jerry Bradley in Treatment: 0 Activities of Daily Living Items Answer Activities of Daily Living (Please select one for each item) Drive Automobile Not Able T Medications ake Completely Able Use T elephone Completely Able Care for Appearance Completely Able Use T oilet Completely Able Bath / Shower Completely Able Dress Self Completely Able Feed Self Completely Able Walk Need Assistance Get In / Out Bed Completely Able Housework Completely Able Prepare Meals Completely Able Handle Money Completely Able Shop for Self Completely Able Electronic Signature(s) Signed: 07/17/2021 5:10:40 PM By: Jerry Stall RN, BSN Entered  By: Jerry Bradley on 07/17/2021 09:59:22 -------------------------------------------------------------------------------- Education Screening Details Patient Name: Date of Service: Jerry Bradley UE 07/17/2021 9:45 A M Medical Record Number: [id number] Patient Account Number: [id number] Date of Birth/Sex: Treating RN: Sep 30, 1950 (71 y.o. Jerry Bradley Primary Care Therese Rocco: Jerry Bradley Other Clinician: Referring Sebasthian Stailey: Treating Lashay Osborne/Extender: Jerry Bradley in Treatment: 0 Primary Learner Assessed: Patient Learning Preferences/Education Level/Primary Language Learning Preference: Explanation, Demonstration, Printed Material Highest Education Level: Grade School Preferred Language: Spanish; Castilian Cognitive Barrier Yes patient speaks Spanish; patient and son signed waiver for son to be interpreter Language Barrier: for patient [facility]. Translator Needed: Yes Memory Deficit: No Emotional Barrier: No Cultural/Religious Beliefs Affecting Medical Care: No Physical Barrier Impaired Vision: No Impaired Hearing: No Decreased Hand dexterity: No Knowledge/Comprehension Knowledge Level: Medium Comprehension Level: Medium Ability to understand written instructions: Medium Ability to understand verbal instructions: Medium Motivation Anxiety Level: Calm Cooperation: Cooperative Education Importance: Acknowledges Need Interest in Health Problems: Asks Questions Perception: Coherent Willingness to Engage in Self-Management High Activities: Readiness to Engage in Self-Management High Activities: Electronic Signature(s) Signed: 07/17/2021 5:10:40 PM By: Jerry Stall RN, BSN Entered By: Jerry Bradley on 07/17/2021 09:57:53 -------------------------------------------------------------------------------- Fall Risk Assessment Details Patient Name: Date of Service: Jerry Bradley, Jerry Bradley UE 07/17/2021 9:45 A M Medical Record Number:  [id number] Patient Account Number: [id number] Date of Birth/Sex: Treating RN: 1950-11-05 (71 y.o. Jerry Bradley, Jerry Bradley Primary Care Dacie Mandel: Jerry Bradley Other Clinician: Referring Lasheka Kempner: Treating Aryam Zhan/Extender: Jerry Bradley in Treatment: 0 Fall Risk Assessment Items Have you had 2 or more falls in the last 12 monthso 0 No Have you had any fall that resulted in injury in the last 12 monthso 0 No FALLS RISK SCREEN History of falling - immediate or within 3 months 0 No Secondary diagnosis (Do you have 2 or more medical diagnoseso) 0 No Ambulatory aid None/bed rest/wheelchair/nurse  0 No Crutches/cane/walker 15 Yes Furniture 0 No Intravenous therapy Access/Saline/Heparin Lock 0 No Gait/Transferring Normal/ bed rest/ wheelchair 0 Yes Weak (short steps with or without shuffle, stooped but able to lift head while walking, may seek 0 No support from furniture) Impaired (short steps with shuffle, may have difficulty arising from chair, head down, impaired 0 No balance) Mental Status Oriented to own ability 0 Yes Electronic Signature(s) Signed: 07/17/2021 5:10:40 PM By: Jerry Stall RN, BSN Entered By: Jerry Bradley on 07/17/2021 09:58:14 -------------------------------------------------------------------------------- Foot Assessment Details Patient Name: Date of Service: Jerry Bradley UE 07/17/2021 9:45 A M Medical Record Number: [id number] Patient Account Number: [id number] Date of Birth/Sex: Treating RN: [birth year] (71 y.o. Jerry Bradley Primary Care Darragh Nay: Jerry Bradley Other Clinician: Referring Lyman Balingit: Treating Deaysia Grigoryan/Extender: Jerry Bradley in Treatment: 0 Foot Assessment Items Site Locations + = Sensation present, - = Sensation absent, C = Callus, U = Ulcer R = Redness, W = Warmth, M = Maceration, PU = Pre-ulcerative lesion F = Fissure, S = Swelling, D = Dryness Assessment Right: Left: Other  Deformity: No No Prior Foot Ulcer: No No Prior Amputation: Yes No Charcot Joint: No No Ambulatory Status: Ambulatory With Help Assistance Device: Cane Gait: Steady Electronic Signature(s) Signed: 07/17/2021 5:10:40 PM By: Jerry Stall RN, BSN Entered By: Jerry Bradley on 07/17/2021 10:07:44 -------------------------------------------------------------------------------- Nutrition Risk Screening Details Patient Name: Date of Service: Jerry Bradley UE 07/17/2021 9:45 A M Medical Record Number: [id number] Patient Account Number: [id number] Date of Birth/Sex: Treating RN: 27-Apr-1950 (71 y.o. Jerry Bradley Primary Care Nichele Slawson: Jerry Bradley Other Clinician: Referring Yasmyn Bellisario: Treating Chery Giusto/Extender: Jerry Bradley in Treatment: 0 Height (in): 65 Weight (lbs): Body Mass Index (BMI): Nutrition Risk Screening Items Score Screening NUTRITION RISK SCREEN: I have an illness or condition that made me change the kind and/or amount of food I eat 2 Yes I eat fewer than two meals per day 0 No I eat few fruits and vegetables, or milk products 0 No I have three or more drinks of beer, liquor or wine almost every day 0 No I have tooth or mouth problems that make it hard for me to eat 0 No I don't always have enough money to buy the food I need 0 No I eat alone most of the time 0 No I take three or more different prescribed or over-the-counter drugs a day 1 Yes Without wanting to, I have lost or gained 10 pounds in the last six months 0 No I am not always physically able to shop, cook and/or feed myself 0 No Nutrition Protocols Good Risk Protocol Moderate Risk Protocol 0 Provide education on nutrition High Risk Proctocol Risk Level: Moderate Risk Score: 3 Electronic Signature(s) Signed: 07/17/2021 5:10:40 PM By: Jerry Stall RN, BSN Entered By: Jerry Bradley on 07/17/2021 09:58:28

## 2021-07-20 LAB — HEPATITIS B SURFACE ANTIBODY,QUALITATIVE: Hep B S Ab: NONREACTIVE

## 2021-07-20 LAB — HEPATITIS C RNA QUANTITATIVE
HCV Quantitative Log: 5.99 log IU/mL — ABNORMAL HIGH
HCV RNA, PCR, QN: 977000 IU/mL — ABNORMAL HIGH

## 2021-07-20 LAB — HEPATITIS B SURFACE ANTIGEN: Hepatitis B Surface Ag: NONREACTIVE

## 2021-07-20 LAB — HEPATITIS B CORE ANTIBODY, TOTAL: Hep B Core Total Ab: REACTIVE — AB

## 2021-07-20 LAB — HEPATITIS A ANTIBODY, TOTAL: Hepatitis A AB,Total: REACTIVE — AB

## 2021-07-24 ENCOUNTER — Encounter (HOSPITAL_BASED_OUTPATIENT_CLINIC_OR_DEPARTMENT_OTHER): Payer: Self-pay | Admitting: Internal Medicine

## 2021-07-24 DIAGNOSIS — L97929 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 with unspecified severity: Secondary | ICD-10-CM
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
## 2021-07-24 NOTE — Progress Notes (Signed)
MUBASHIR, MALLEK ([id number]) Visit Report for 07/24/2021 Arrival Information Details Patient Name: Date of Service: [name] 07/24/2021 3:45 PM Medical Record Number: [id number] Patient Account Number: [id number] Date of Birth/Sex: Treating RN: Aug 04, 1950 (71 y.o. Marcheta Grammes Primary Care Zacariah Belue: Asencion Noble Other Clinician: Referring Lynore Coscia: Treating Kato Wieczorek/Extender: Madelynn Done in Treatment: 1 Visit Information History Since Last Visit Added or deleted any medications: No Patient Arrived: [name] Any new allergies or adverse reactions: No Arrival Time: 15:41 Had a fall or experienced change in No Transfer Assistance: None activities of daily living that may affect Patient Identification Verified: Yes risk of falls: Secondary Verification Process Completed: Yes Signs or symptoms of abuse/neglect since last visito No Patient Requires Transmission-Based Precautions: No Hospitalized since last visit: No Patient Has Alerts: Yes Implantable device outside of the clinic excluding No Patient Alerts: Translator Required cellular tissue based products placed in the center since last visit: Has Dressing in Place as Prescribed: Yes Has Compression in Place as Prescribed: Yes Pain Present Now: No Notes patient's son did not come, left messages for son as he has signed the waiver to be his interpreter to be here with him or an interpreter will be assigned to patient in order to treat. Patient is not aware of the [facility] topical antibiotics- son Dominica Severin who I, nurse spoke with early in the week, did not explain to patient according to the interpreter's line via phone. Electronic Signature(s) Signed: 07/24/2021 5:00:34 PM By: Deon Pilling RN, BSN Entered By: Deon Pilling on 07/24/2021 16:29:20 -------------------------------------------------------------------------------- Compression Therapy Details Patient Name: Date of  Service: [name] 07/24/2021 3:45 PM Medical Record Number: [id number] Patient Account Number: [id number] Date of Birth/Sex: Treating RN: 1950-02-04 (71 y.o. Hessie Diener Primary Care Leilanni Halvorson: Asencion Noble Other Clinician: Referring Ran Tullis: Treating Quinten Allerton/Extender: Madelynn Done in Treatment: 1 Compression Therapy Performed for Wound Assessment: Wound #1 Left,Anterior Lower Leg Performed By: Clinician Deon Pilling, RN Compression Type: Three Layer Post Procedure Diagnosis Same as Pre-procedure Electronic Signature(s) Signed: 07/24/2021 5:00:34 PM By: Deon Pilling RN, BSN Entered By: Deon Pilling on 07/24/2021 16:31:10 -------------------------------------------------------------------------------- Encounter Discharge Information Details Patient Name: Date of Service: [name], [name] 07/24/2021 3:45 PM Medical Record Number: [id number] Patient Account Number: [id number] Date of Birth/Sex: Treating RN: 29-Jun-1950 (70 y.o. Hessie Diener Primary Care Toni Hoffmeister: Asencion Noble Other Clinician: Referring Annalucia Laino: Treating Waleed Dettman/Extender: Madelynn Done in Treatment: 1 Encounter Discharge Information Items Post Procedure Vitals Discharge Condition: Stable Temperature (F): 98.9 Ambulatory Status: Ambulatory Pulse (bpm): 71 Discharge Destination: Home Respiratory Rate (breaths/min): 16 Transportation: Private Auto Blood Pressure (mmHg): 154/77 Accompanied By: self Schedule Follow-up Appointment: Yes Clinical Summary of Care: Electronic Signature(s) Signed: 07/24/2021 5:00:34 PM By: Deon Pilling RN, BSN Entered By: Deon Pilling on 07/24/2021 16:36:58 -------------------------------------------------------------------------------- Lower Extremity Assessment Details Patient Name: Date of Service: [name] 07/24/2021 3:45 PM Medical Record Number: [id number] Patient Account  Number: [id number] Date of Birth/Sex: Treating RN: 16-Mar-1950 (71 y.o. Marcheta Grammes Primary Care Malijah Lietz: Asencion Noble Other Clinician: Referring Anum Palecek: Treating Kennedy Brines/Extender: Madelynn Done in Treatment: 1 Edema Assessment Assessed: [name]: Yes] [name]: No] Edema: [Left: Ye] [Right: s] Calf Left: Right: Point of Measurement: 34 cm From Medial Instep 32 cm Ankle Left: Right: Point of Measurement: 11 cm From Medial Instep 23 cm Vascular Assessment Pulses: Dorsalis Pedis Palpable: [Left:Yes] Electronic Signature(s) Signed: 07/24/2021 4:43:44 PM By: Lorrin Jackson Entered By:  Lorrin Jackson on 07/24/2021 15:50:37 -------------------------------------------------------------------------------- Multi Wound Chart Details Patient Name: Date of Service: [name] 07/24/2021 3:45 PM Medical Record Number: [id number] Patient Account Number: [id number] Date of Birth/Sex: Treating RN: 14-Jun-1950 (71 y.o. Hessie Diener Primary Care Yoshio Seliga: Asencion Noble Other Clinician: Referring Anner Baity: Treating Basil Blakesley/Extender: Madelynn Done in Treatment: 1 Vital Signs Height(in): 71 Pulse(bpm): 76 Weight(lbs): Blood Pressure(mmHg): 154/77 Body Mass Index(BMI): Temperature(F): 98.9 Respiratory Rate(breaths/min): 16 Photos: [N/A:N/A] Left, Anterior Lower Leg N/A N/A Wound Location: Gradually Appeared N/A N/A Wounding Event: Venous Leg Ulcer N/A N/A Primary Etiology: Asthma, Chronic Obstructive N/A N/A Comorbid History: Pulmonary Disease (COPD), Hypertension, Hepatitis C 05/26/2021 N/A N/A Date Acquired: 1 N/A N/A Weeks of Treatment: Open N/A N/A Wound Status: No N/A N/A Wound Recurrence: Yes N/A N/A Clustered Wound: 6 N/A N/A Clustered Quantity: 10x5x0.1 N/A N/A Measurements L x W x D (cm) 39.27 N/A N/A A (cm) : rea 3.927 N/A N/A Volume (cm) : 5.70% N/A N/A % Reduction in A  rea: 5.70% N/A N/A % Reduction in Volume: Full Thickness Without Exposed N/A N/A Classification: Support Structures Large N/A N/A Exudate A mount: Serosanguineous N/A N/A Exudate Type: red, brown N/A N/A Exudate Color: Distinct, outline attached N/A N/A Wound Margin: Small (1-33%) N/A N/A Granulation A mount: Pink N/A N/A Granulation Quality: Large (67-100%) N/A N/A Necrotic A mount: Fat Layer (Subcutaneous Tissue): Yes N/A N/A Exposed Structures: Fascia: No Tendon: No Muscle: No Joint: No Bone: No Small (1-33%) N/A N/A Epithelialization: Debridement - Excisional N/A N/A Debridement: Pre-procedure Verification/Time Out 16:25 N/A N/A Taken: Lidocaine 5% topical ointment N/A N/A Pain Control: Subcutaneous, Slough N/A N/A Tissue Debrided: Skin/Subcutaneous Tissue N/A N/A Level: 40 N/A N/A Debridement A (sq cm): rea Curette N/A N/A Instrument: Minimum N/A N/A Bleeding: Pressure N/A N/A Hemostasis A chieved: 0 N/A N/A Procedural Pain: 0 N/A N/A Post Procedural Pain: Procedure was tolerated well N/A N/A Debridement Treatment Response: 10x5x0.1 N/A N/A Post Debridement Measurements L x W x D (cm) 3.927 N/A N/A Post Debridement Volume: (cm) Compression Therapy N/A N/A Procedures Performed: Debridement Treatment Notes Wound #1 (Lower Leg) Wound Laterality: Left, Anterior Cleanser Soap and Water Discharge Instruction: May shower and wash wound with dial antibacterial soap and water prior to dressing change. Peri-Wound Care Triamcinolone 15 (g) Discharge Instruction: Use triamcinolone 15 (g) as directed Sween Lotion (Moisturizing lotion) Discharge Instruction: Apply moisturizing lotion as directed Topical Primary Dressing IODOFLEX 0.9% Cadexomer Iodine Pad 4x6 cm Discharge Instruction: Apply to wound bed as instructed Secondary Dressing ABD Pad, 8x10 Discharge Instruction: Apply over primary dressing as directed. Woven Gauze Sponge, Non-Sterile 4x4  in Discharge Instruction: Apply over primary dressing as directed. Zetuvit Plus 4x8 in Discharge Instruction: Apply over primary dressing as directed. Secured With Compression Wrap ThreePress (3 layer compression wrap) Discharge Instruction: Apply three layer compression as directed. Compression Stockings Add-Ons Electronic Signature(s) Signed: 07/24/2021 4:44:23 PM By: Kalman Shan DO Signed: 07/24/2021 5:00:34 PM By: Deon Pilling RN, BSN Entered By: Kalman Shan on 07/24/2021 16:39:46 -------------------------------------------------------------------------------- Multi-Disciplinary Care Plan Details Patient Name: Date of Service: [name], [name] 07/24/2021 3:45 PM Medical Record Number: [id number] Patient Account Number: [id number] Date of Birth/Sex: Treating RN: June 26, 1950 (71 y.o. Marcheta Grammes Primary Care Alis Sawchuk: Asencion Noble Other Clinician: Referring Jacaden Forbush: Treating Noma Quijas/Extender: Madelynn Done in Treatment: 1 Active Inactive Necrotic Tissue Nursing Diagnoses: Impaired tissue integrity related to necrotic/devitalized tissue Goals: Necrotic/devitalized tissue will be minimized in the wound bed Date  Initiated: 07/17/2021 Target Resolution Date: 08/01/2021 Goal Status: Active Patient/caregiver will verbalize understanding of reason and process for debridement of necrotic tissue Date Initiated: 07/17/2021 Target Resolution Date: 08/01/2021 Goal Status: Active Interventions: Assess patient pain level pre-, during and post procedure and prior to discharge Treatment Activities: Apply topical anesthetic as ordered : 07/17/2021 Excisional debridement : 07/17/2021 Notes: Orientation to the Wound Care Program Nursing Diagnoses: Knowledge deficit related to the wound healing center program Goals: Patient/caregiver will verbalize understanding of the [facility] Program Date Initiated: 07/17/2021 Target Resolution  Date: 08/01/2021 Goal Status: Active Interventions: Provide education on orientation to the wound center Notes: Pain, Acute or Chronic Nursing Diagnoses: Pain, acute or chronic: actual or potential Potential alteration in comfort, pain Goals: Patient will verbalize adequate pain control and receive pain control interventions during procedures as needed Date Initiated: 07/17/2021 Target Resolution Date: 07/31/2021 Goal Status: Active Patient/caregiver will verbalize comfort level met Date Initiated: 07/17/2021 Target Resolution Date: 08/01/2021 Goal Status: Active Interventions: Encourage patient to take pain medications as prescribed Provide education on pain management Reposition patient for comfort Treatment Activities: Administer pain control measures as ordered : 07/17/2021 Notes: Wound/Skin Impairment Nursing Diagnoses: Knowledge deficit related to ulceration/compromised skin integrity Goals: Patient will demonstrate a reduced rate of smoking or cessation of smoking Date Initiated: 07/17/2021 Target Resolution Date: 08/01/2021 Goal Status: Active Patient/caregiver will verbalize understanding of skin care regimen Date Initiated: 07/17/2021 Target Resolution Date: 08/01/2021 Goal Status: Active Interventions: Assess patient/caregiver ability to perform ulcer/skin care regimen upon admission and as needed Assess ulceration(s) every visit Provide education on smoking Provide education on ulcer and skin care Treatment Activities: Skin care regimen initiated : 07/17/2021 Smoking cessation education : 07/17/2021 Topical wound management initiated : 07/17/2021 Notes: Electronic Signature(s) Signed: 07/24/2021 4:43:44 PM By: Lorrin Jackson Entered By: Lorrin Jackson on 07/24/2021 15:55:47 -------------------------------------------------------------------------------- Pain Assessment Details Patient Name: Date of Service: [name] 07/24/2021 3:45 PM Medical Record  Number: [id number] Patient Account Number: [id number] Date of Birth/Sex: Treating RN: 1950/12/18 (71 y.o. Marcheta Grammes Primary Care Chelsy Parrales: Asencion Noble Other Clinician: Referring Eliam Snapp: Treating Galen Malkowski/Extender: Madelynn Done in Treatment: 1 Active Problems Location of Pain Severity and Description of Pain Patient Has Paino No Site Locations Pain Management and Medication Current Pain Management: Electronic Signature(s) Signed: 07/24/2021 4:43:44 PM By: Lorrin Jackson Entered By: Lorrin Jackson on 07/24/2021 15:46:27 -------------------------------------------------------------------------------- Patient/Caregiver Education Details Patient Name: Date of Service: [name] 6/29/2023andnbsp3:45 PM Medical Record Number: [id number] Patient Account Number: [id number] Date of Birth/Gender: Treating RN: 10-Feb-1950 (71 y.o. Marcheta Grammes Primary Care Physician: Asencion Noble Other Clinician: Referring Physician: Treating Physician/Extender: Madelynn Done in Treatment: 1 Education Assessment Education Provided To: Patient Education Topics Provided Smoking and Wound Healing: Methods: Explain/Verbal, Printed Responses: State content correctly Venous: Methods: Explain/Verbal, Printed Responses: State content correctly Wound/Skin Impairment: Methods: Explain/Verbal, Printed Responses: State content correctly Electronic Signature(s) Signed: 07/24/2021 4:43:44 PM By: Lorrin Jackson Entered By: Lorrin Jackson on 07/24/2021 15:56:23 -------------------------------------------------------------------------------- Wound Assessment Details Patient Name: Date of Service: [name] 07/24/2021 3:45 PM Medical Record Number: [id number] Patient Account Number: [id number] Date of Birth/Sex: Treating RN: 1950/03/07 (71 y.o. Marcheta Grammes Primary Care Sabian Kuba: Asencion Noble Other  Clinician: Referring Alvia Tory: Treating Maliya Marich/Extender: Madelynn Done in Treatment: 1 Wound Status Wound Number: 1 Primary Venous Leg Ulcer Etiology: Wound Location: Left, Anterior Lower Leg Wound Open Wounding Event: Gradually Appeared Status: Date Acquired: 05/26/2021 Comorbid  Asthma, Chronic Obstructive Pulmonary Disease (COPD), Weeks Of Treatment: 1 History: Hypertension, Hepatitis C Clustered Wound: Yes Photos Wound Measurements Length: (cm) 10 Width: (cm) 5 Depth: (cm) 0.1 Clustered Quantity: 6 Area: (cm) 39.27 Volume: (cm) 3.927 % Reduction in Area: 5.7% % Reduction in Volume: 5.7% Epithelialization: Small (1-33%) Tunneling: No Undermining: No Wound Description Classification: Full Thickness Without Exposed Support Stru Wound Margin: Distinct, outline attached Exudate Amount: Large Exudate Type: Serosanguineous Exudate Color: red, brown ctures Foul Odor After Cleansing: No Slough/Fibrino Yes Wound Bed Granulation Amount: Small (1-33%) Exposed Structure Granulation Quality: Pink Fascia Exposed: No Necrotic Amount: Large (67-100%) Fat Layer (Subcutaneous Tissue) Exposed: Yes Necrotic Quality: Adherent Slough Tendon Exposed: No Muscle Exposed: No Joint Exposed: No Bone Exposed: No Treatment Notes Wound #1 (Lower Leg) Wound Laterality: Left, Anterior Cleanser Soap and Water Discharge Instruction: May shower and wash wound with dial antibacterial soap and water prior to dressing change. Peri-Wound Care Triamcinolone 15 (g) Discharge Instruction: Use triamcinolone 15 (g) as directed Sween Lotion (Moisturizing lotion) Discharge Instruction: Apply moisturizing lotion as directed Topical Primary Dressing IODOFLEX 0.9% Cadexomer Iodine Pad 4x6 cm Discharge Instruction: Apply to wound bed as instructed Secondary Dressing ABD Pad, 8x10 Discharge Instruction: Apply over primary dressing as directed. Woven Gauze Sponge,  Non-Sterile 4x4 in Discharge Instruction: Apply over primary dressing as directed. Zetuvit Plus 4x8 in Discharge Instruction: Apply over primary dressing as directed. Secured With Compression Wrap ThreePress (3 layer compression wrap) Discharge Instruction: Apply three layer compression as directed. Compression Stockings Add-Ons Electronic Signature(s) Signed: 07/24/2021 4:43:44 PM By: Lorrin Jackson Entered By: Lorrin Jackson on 07/24/2021 15:58:19 -------------------------------------------------------------------------------- Vitals Details Patient Name: Date of Service: [name], [name] 07/24/2021 3:45 PM Medical Record Number: [id number] Patient Account Number: [id number] Date of Birth/Sex: Treating RN: 02-07-1950 (71 y.o. Marcheta Grammes Primary Care Ramesses Crampton: Asencion Noble Other Clinician: Referring Lindy Garczynski: Treating Arianna Haydon/Extender: Madelynn Done in Treatment: 1 Vital Signs Time Taken: 15:44 Temperature (F): 98.9 Height (in): 65 Pulse (bpm): 71 Respiratory Rate (breaths/min): 16 Blood Pressure (mmHg): 154/77 Reference Range: 80 - 120 mg / dl Electronic Signature(s) Signed: 07/24/2021 4:43:44 PM By: Lorrin Jackson Entered By: Lorrin Jackson on 07/24/2021 15:46:22

## 2021-07-31 ENCOUNTER — Encounter (HOSPITAL_BASED_OUTPATIENT_CLINIC_OR_DEPARTMENT_OTHER): Payer: Self-pay | Attending: Internal Medicine | Admitting: Internal Medicine

## 2021-07-31 DIAGNOSIS — B182 Chronic viral hepatitis C: Secondary | ICD-10-CM | POA: Insufficient documentation

## 2021-07-31 DIAGNOSIS — G8929 Other chronic pain: Secondary | ICD-10-CM | POA: Insufficient documentation

## 2021-07-31 DIAGNOSIS — Z89611 Acquired absence of right leg above knee: Secondary | ICD-10-CM | POA: Insufficient documentation

## 2021-07-31 DIAGNOSIS — L97929 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 with unspecified severity: Secondary | ICD-10-CM

## 2021-07-31 DIAGNOSIS — I87322 Chronic venous hypertension (idiopathic) with inflammation of left lower extremity: Secondary | ICD-10-CM | POA: Insufficient documentation

## 2021-07-31 DIAGNOSIS — I10 Essential (primary) hypertension: Secondary | ICD-10-CM | POA: Insufficient documentation

## 2021-07-31 NOTE — Progress Notes (Signed)
LARONN, DEVONSHIRE ([id number]) Visit Report for 07/31/2021 Arrival Information Details Patient Name: Date of Service: Jerry Bradley UE 07/31/2021 2:15 PM Medical Record Number: [id number] Patient Account Number: [id number] Date of Birth/Sex: Treating RN: 03/09/1950 (71 y.o. Burnadette Pop, Lauren Primary Care Pauletta Pickney: Asencion Noble Other Clinician: Referring Berneice Zettlemoyer: Treating Margaretta Chittum/Extender: Madelynn Done in Treatment: 2 Visit Information History Since Last Visit Added or deleted any medications: No Patient Arrived: Jerry Bradley Any new allergies or adverse reactions: No Arrival Time: 14:19 Had a fall or experienced change in No Accompanied By: son activities of daily living that may affect Transfer Assistance: None risk of falls: Patient Identification Verified: Yes Signs or symptoms of abuse/neglect since last visito No Secondary Verification Process Completed: Yes Hospitalized since last visit: No Patient Requires Transmission-Based Precautions: No Implantable device outside of the clinic excluding No Patient Has Alerts: Yes cellular tissue based products placed in the center Patient Alerts: Translator Required since last visit: Has Dressing in Place as Prescribed: Yes Pain Present Now: No Electronic Signature(s) Signed: 07/31/2021 4:10:18 PM By: Erenest Blank Entered By: Erenest Blank on 07/31/2021 14:37:45 -------------------------------------------------------------------------------- Compression Therapy Details Patient Name: Date of Service: Jerry Bradley UE 07/31/2021 2:15 PM Medical Record Number: [id number] Patient Account Number: [id number] Date of Birth/Sex: Treating RN: 06/12/1950 (71 y.o. Hessie Diener Primary Care Jaquila Santelli: Asencion Noble Other Clinician: Referring Mcihael Hinderman: Treating Loralyn Rachel/Extender: Madelynn Done in Treatment: 2 Compression Therapy Performed for Wound Assessment: Wound #1  Left,Anterior Lower Leg Performed By: Clinician Deon Pilling, RN Compression Type: Three Layer Post Procedure Diagnosis Same as Pre-procedure Electronic Signature(s) Signed: 07/31/2021 4:18:35 PM By: Deon Pilling RN, BSN Entered By: Deon Pilling on 07/31/2021 14:56:00 -------------------------------------------------------------------------------- Encounter Discharge Information Details Patient Name: Date of Service: Jerry Bradley, Jerry Bradley UE 07/31/2021 2:15 PM Medical Record Number: [id number] Patient Account Number: [id number] Date of Birth/Sex: Treating RN: [birth year] (70 y.o. Hessie Diener Primary Care Brittyn Salaz: Asencion Noble Other Clinician: Referring Jarrah Babich: Treating Tylyn Stankovich/Extender: Madelynn Done in Treatment: 2 Encounter Discharge Information Items Post Procedure Vitals Discharge Condition: Stable Temperature (F): 98.1 Ambulatory Status: Cane Pulse (bpm): 62 Discharge Destination: Home Respiratory Rate (breaths/min): 20 Transportation: Private Auto Blood Pressure (mmHg): 152/74 Accompanied By: son Schedule Follow-up Appointment: Yes Clinical Summary of Care: Electronic Signature(s) Signed: 07/31/2021 4:18:35 PM By: Deon Pilling RN, BSN Entered By: Deon Pilling on 07/31/2021 14:58:34 -------------------------------------------------------------------------------- Lower Extremity Assessment Details Patient Name: Date of Service: Jerry Bradley UE 07/31/2021 2:15 PM Medical Record Number: [id number] Patient Account Number: [id number] Date of Birth/Sex: Treating RN: 02/29/1950 (71 y.o. Hessie Diener Primary Care Deanna Boehlke: Asencion Noble Other Clinician: Referring Keenya Matera: Treating Lam Bjorklund/Extender: Madelynn Done in Treatment: 2 Edema Assessment Assessed: [name]: No] [name]: No] Edema: [Left: Ye] [Right: s] Calf Left: Right: Point of Measurement: 34 cm From Medial Instep 31.4 cm Ankle Left:  Right: Point of Measurement: 11 cm From Medial Instep 22 cm Vascular Assessment Pulses: Dorsalis Pedis Palpable: [Left:Yes] Electronic Signature(s) Signed: 07/31/2021 4:10:18 PM By: Erenest Blank Signed: 07/31/2021 4:18:35 PM By: Deon Pilling RN, BSN Entered By: Erenest Blank on 07/31/2021 14:37:37 -------------------------------------------------------------------------------- Multi Wound Chart Details Patient Name: Date of Service: Jerry Bradley, Jerry Bradley UE 07/31/2021 2:15 PM Medical Record Number: [id number] Patient Account Number: [id number] Date of Birth/Sex: Treating RN: [birth year] (71 y.o. Hessie Diener Primary Care Wolfe Camarena: Asencion Noble Other Clinician: Referring Destry Dauber: Treating Carma Dwiggins/Extender: Madelynn Done in Treatment: 2 Vital Signs Height(in): 65 Pulse(bpm):  22 Weight(lbs): Blood Pressure(mmHg): 152/74 Body Mass Index(BMI): Temperature(F): 98.1 Respiratory Rate(breaths/min): 17 Photos: [N/A:N/A] Left, Anterior Lower Leg N/A N/A Wound Location: Gradually Appeared N/A N/A Wounding Event: Venous Leg Ulcer N/A N/A Primary Etiology: Asthma, Chronic Obstructive N/A N/A Comorbid History: Pulmonary Disease (COPD), Hypertension, Hepatitis C 05/26/2021 N/A N/A Date Acquired: 2 N/A N/A Weeks of Treatment: Open N/A N/A Wound Status: No N/A N/A Wound Recurrence: Yes N/A N/A Clustered Wound: 6 N/A N/A Clustered Quantity: 9.9x4x0.2 N/A N/A Measurements L x W x D (cm) 31.102 N/A N/A A (cm) : rea 6.22 N/A N/A Volume (cm) : 25.30% N/A N/A % Reduction in A rea: -49.30% N/A N/A % Reduction in Volume: Full Thickness Without Exposed N/A N/A Classification: Support Structures Large N/A N/A Exudate A mount: Serosanguineous N/A N/A Exudate Type: red, brown N/A N/A Exudate Color: Distinct, outline attached N/A N/A Wound Margin: Small (1-33%) N/A N/A Granulation A mount: Pink N/A N/A Granulation Quality: Large  (67-100%) N/A N/A Necrotic A mount: Fat Layer (Subcutaneous Tissue): Yes N/A N/A Exposed Structures: Fascia: No Tendon: No Muscle: No Joint: No Bone: No Small (1-33%) N/A N/A Epithelialization: Debridement - Excisional N/A N/A Debridement: Pre-procedure Verification/Time Out 14:50 N/A N/A Taken: Lidocaine 5% topical ointment N/A N/A Pain Control: Subcutaneous, Slough N/A N/A Tissue Debrided: Skin/Subcutaneous Tissue N/A N/A Level: 36 N/A N/A Debridement A (sq cm): rea Curette N/A N/A Instrument: Minimum N/A N/A Bleeding: Pressure N/A N/A Hemostasis A chieved: 0 N/A N/A Procedural Pain: 0 N/A N/A Post Procedural Pain: Procedure was tolerated well N/A N/A Debridement Treatment Response: 9.9x4x0.2 N/A N/A Post Debridement Measurements L x W x D (cm) 6.22 N/A N/A Post Debridement Volume: (cm) Compression Therapy N/A N/A Procedures Performed: Debridement Treatment Notes Wound #1 (Lower Leg) Wound Laterality: Left, Anterior Cleanser Soap and Water Discharge Instruction: May shower and wash wound with dial antibacterial soap and water prior to dressing change. Peri-Wound Care Triamcinolone 15 (g) Discharge Instruction: Use triamcinolone 15 (g) as directed Sween Lotion (Moisturizing lotion) Discharge Instruction: Apply moisturizing lotion as directed Topical Keystone compounding topical antibiotics Discharge Instruction: apply under iodoflex. Primary Dressing IODOFLEX 0.9% Cadexomer Iodine Pad 4x6 cm Discharge Instruction: Apply to wound bed as instructed Secondary Dressing ABD Pad, 8x10 Discharge Instruction: Apply over primary dressing as directed. Woven Gauze Sponge, Non-Sterile 4x4 in Discharge Instruction: Apply over primary dressing as directed. Zetuvit Plus 4x8 in Discharge Instruction: Apply over primary dressing as directed. Secured With Compression Wrap ThreePress (3 layer compression wrap) Discharge Instruction: Apply three layer compression  as directed. Compression Stockings Add-Ons Electronic Signature(s) Signed: 07/31/2021 3:39:27 PM By: Kalman Shan DO Signed: 07/31/2021 4:18:35 PM By: Deon Pilling RN, BSN Entered By: Kalman Shan on 07/31/2021 14:59:01 -------------------------------------------------------------------------------- Multi-Disciplinary Care Plan Details Patient Name: Date of Service: Jerry Bradley, Jerry Bradley UE 07/31/2021 2:15 PM Medical Record Number: [id number] Patient Account Number: [id number] Date of Birth/Sex: Treating RN: 1950-06-06 (71 y.o. Hessie Diener Primary Care Rilee Knoll: Asencion Noble Other Clinician: Referring Umaiza Matusik: Treating Aviendha Azbell/Extender: Madelynn Done in Treatment: 2 Active Inactive Necrotic Tissue Nursing Diagnoses: Impaired tissue integrity related to necrotic/devitalized tissue Goals: Necrotic/devitalized tissue will be minimized in the wound bed Date Initiated: 07/17/2021 Target Resolution Date: 08/01/2021 Goal Status: Active Patient/caregiver will verbalize understanding of reason and process for debridement of necrotic tissue Date Initiated: 07/17/2021 Target Resolution Date: 08/01/2021 Goal Status: Active Interventions: Assess patient pain level pre-, during and post procedure and prior to discharge Treatment Activities: Apply topical anesthetic as ordered : 07/17/2021 Excisional debridement :  07/17/2021 Notes: Pain, Acute or Chronic Nursing Diagnoses: Pain, acute or chronic: actual or potential Potential alteration in comfort, pain Goals: Patient will verbalize adequate pain control and receive pain control interventions during procedures as needed Date Initiated: 07/17/2021 Target Resolution Date: 07/31/2021 Goal Status: Active Patient/caregiver will verbalize comfort level met Date Initiated: 07/17/2021 Target Resolution Date: 08/01/2021 Goal Status: Active Interventions: Encourage patient to take pain medications as  prescribed Provide education on pain management Reposition patient for comfort Treatment Activities: Administer pain control measures as ordered : 07/17/2021 Notes: Wound/Skin Impairment Nursing Diagnoses: Knowledge deficit related to ulceration/compromised skin integrity Goals: Patient will demonstrate a reduced rate of smoking or cessation of smoking Date Initiated: 07/17/2021 Target Resolution Date: 08/01/2021 Goal Status: Active Patient/caregiver will verbalize understanding of skin care regimen Date Initiated: 07/17/2021 Target Resolution Date: 08/01/2021 Goal Status: Active Interventions: Assess patient/caregiver ability to perform ulcer/skin care regimen upon admission and as needed Assess ulceration(s) every visit Provide education on smoking Provide education on ulcer and skin care Treatment Activities: Skin care regimen initiated : 07/17/2021 Smoking cessation education : 07/17/2021 Topical wound management initiated : 07/17/2021 Notes: Electronic Signature(s) Signed: 07/31/2021 4:18:35 PM By: Deon Pilling RN, BSN Entered By: Deon Pilling on 07/31/2021 14:57:32 -------------------------------------------------------------------------------- Pain Assessment Details Patient Name: Date of Service: Jerry Bradley UE 07/31/2021 2:15 PM Medical Record Number: [id number] Patient Account Number: [id number] Date of Birth/Sex: Treating RN: [birth year] (71 y.o. Burnadette Pop, Lauren Primary Care Mamta Rimmer: Asencion Noble Other Clinician: Referring Tashema Tiller: Treating Margeaux Swantek/Extender: Madelynn Done in Treatment: 2 Active Problems Location of Pain Severity and Description of Pain Patient Has Paino No Site Locations Pain Management and Medication Current Pain Management: Electronic Signature(s) Signed: 07/31/2021 4:05:15 PM By: Rhae Hammock RN Signed: 07/31/2021 4:10:18 PM By: Erenest Blank Entered By: Erenest Blank on 07/31/2021  14:37:32 -------------------------------------------------------------------------------- Patient/Caregiver Education Details Patient Name: Date of Service: Jerry Bradley UE 7/6/2023andnbsp2:15 PM Medical Record Number: [id number] Patient Account Number: [id number] Date of Birth/Gender: Treating RN: 11/24/1950 (71 y.o. Hessie Diener Primary Care Physician: Asencion Noble Other Clinician: Referring Physician: Treating Physician/Extender: Madelynn Done in Treatment: 2 Education Assessment Education Provided To: Patient Education Topics Provided Wound/Skin Impairment: Handouts: Skin Care Do's and Dont's Methods: Explain/Verbal Responses: Reinforcements needed, State content correctly Electronic Signature(s) Signed: 07/31/2021 4:18:35 PM By: Deon Pilling RN, BSN Signed: 07/31/2021 4:18:35 PM By: Deon Pilling RN, BSN Entered By: Deon Pilling on 07/31/2021 14:57:44 -------------------------------------------------------------------------------- Wound Assessment Details Patient Name: Date of Service: Jerry Bradley UE 07/31/2021 2:15 PM Medical Record Number: [id number] Patient Account Number: [id number] Date of Birth/Sex: Treating RN: 1950/06/11 (71 y.o. [name], Meta.Reding Primary Care Bomani Oommen: Asencion Noble Other Clinician: Referring Kruz Chiu: Treating Halston Fairclough/Extender: Madelynn Done in Treatment: 2 Wound Status Wound Number: 1 Primary Venous Leg Ulcer Etiology: Wound Location: Left, Anterior Lower Leg Wound Open Wounding Event: Gradually Appeared Status: Date Acquired: 05/26/2021 Comorbid Asthma, Chronic Obstructive Pulmonary Disease (COPD), Weeks Of Treatment: 2 History: Hypertension, Hepatitis C Clustered Wound: Yes Photos Wound Measurements Length: (cm) 9.9 Width: (cm) 4 Depth: (cm) 0.2 Clustered Quantity: 6 Area: (cm) 31.102 Volume: (cm) 6.22 % Reduction in Area: 25.3% % Reduction in Volume:  -49.3% Epithelialization: Small (1-33%) Tunneling: No Undermining: No Wound Description Classification: Full Thickness Without Exposed Support Structures Wound Margin: Distinct, outline attached Exudate Amount: Large Exudate Type: Serosanguineous Exudate Color: red, brown Foul Odor After Cleansing: No Slough/Fibrino Yes Wound Bed Granulation Amount: Small (1-33%) Exposed Structure Granulation Quality: Pink  Fascia Exposed: No Necrotic Amount: Large (67-100%) Fat Layer (Subcutaneous Tissue) Exposed: Yes Necrotic Quality: Adherent Slough Tendon Exposed: No Muscle Exposed: No Joint Exposed: No Bone Exposed: No Treatment Notes Wound #1 (Lower Leg) Wound Laterality: Left, Anterior Cleanser Soap and Water Discharge Instruction: May shower and wash wound with dial antibacterial soap and water prior to dressing change. Peri-Wound Care Triamcinolone 15 (g) Discharge Instruction: Use triamcinolone 15 (g) as directed Sween Lotion (Moisturizing lotion) Discharge Instruction: Apply moisturizing lotion as directed Topical [employer] compounding topical antibiotics Discharge Instruction: apply under iodoflex. Primary Dressing IODOFLEX 0.9% Cadexomer Iodine Pad 4x6 cm Discharge Instruction: Apply to wound bed as instructed Secondary Dressing ABD Pad, 8x10 Discharge Instruction: Apply over primary dressing as directed. Woven Gauze Sponge, Non-Sterile 4x4 in Discharge Instruction: Apply over primary dressing as directed. Zetuvit Plus 4x8 in Discharge Instruction: Apply over primary dressing as directed. Secured With Compression Wrap ThreePress (3 layer compression wrap) Discharge Instruction: Apply three layer compression as directed. Compression Stockings Add-Ons Electronic Signature(s) Signed: 07/31/2021 4:10:18 PM By: Erenest Blank Signed: 07/31/2021 4:18:35 PM By: Deon Pilling RN, BSN Entered By: Erenest Blank on 07/31/2021  14:36:41 -------------------------------------------------------------------------------- Vitals Details Patient Name: Date of Service: Jerry Bradley, Jerry Bradley UE 07/31/2021 2:15 PM Medical Record Number: [id number] Patient Account Number: [id number] Date of Birth/Sex: Treating RN: March 19, 1950 (71 y.o. Burnadette Pop, Lauren Primary Care Jagjit Riner: Asencion Noble Other Clinician: Referring Devlyn Retter: Treating Karalyne Nusser/Extender: Madelynn Done in Treatment: 2 Vital Signs Time Taken: 14:20 Temperature (F): 98.1 Height (in): 65 Pulse (bpm): 62 Respiratory Rate (breaths/min): 17 Blood Pressure (mmHg): 152/74 Reference Range: 80 - 120 mg / dl Electronic Signature(s) Signed: 07/31/2021 4:10:18 PM By: Erenest Blank Entered By: Erenest Blank on 07/31/2021 14:37:49
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
## 2021-08-01 ENCOUNTER — Other Ambulatory Visit: Payer: Self-pay | Admitting: Critical Care Medicine

## 2021-08-01 ENCOUNTER — Other Ambulatory Visit: Payer: Self-pay

## 2021-08-01 NOTE — Telephone Encounter (Signed)
Early request sent via Interface. Refills available. LRF 07/02/21 60ea. 2 refills Requested Prescriptions  Pending Prescriptions Disp Refills  . fluticasone furoate-vilanterol (BREO ELLIPTA) 100-25 MCG/ACT AEPB 60 each 2    Sig: Inhale 1 puff into the lungs daily.     There is no refill protocol information for this order

## 2021-08-05 ENCOUNTER — Encounter: Payer: Self-pay | Admitting: Infectious Diseases

## 2021-08-05 ENCOUNTER — Other Ambulatory Visit: Payer: Self-pay

## 2021-08-05 ENCOUNTER — Ambulatory Visit (INDEPENDENT_AMBULATORY_CARE_PROVIDER_SITE_OTHER): Payer: Self-pay | Admitting: Infectious Diseases

## 2021-08-05 ENCOUNTER — Other Ambulatory Visit: Payer: Self-pay | Admitting: Critical Care Medicine

## 2021-08-05 ENCOUNTER — Other Ambulatory Visit: Payer: Self-pay | Admitting: Pharmacist

## 2021-08-05 VITALS — BP 136/71 | HR 59 | Temp 97.8°F | Wt 143.0 lb

## 2021-08-05 DIAGNOSIS — B182 Chronic viral hepatitis C: Secondary | ICD-10-CM

## 2021-08-05 DIAGNOSIS — L03116 Cellulitis of left lower limb: Secondary | ICD-10-CM

## 2021-08-05 DIAGNOSIS — Z789 Other specified health status: Secondary | ICD-10-CM

## 2021-08-05 MED ORDER — FLUTICASONE FUROATE-VILANTEROL 100-25 MCG/ACT IN AEPB
1.0000 | INHALATION_SPRAY | Freq: Every day | RESPIRATORY_TRACT | 2 refills | Status: DC
Start: 2021-08-05 — End: 2021-10-06
  Filled 2021-08-05: qty 60, 30d supply, fill #0
  Filled 2021-08-05: qty 60, 60d supply, fill #0
  Filled 2021-09-08: qty 60, 30d supply, fill #1
  Filled 2021-10-03: qty 60, 30d supply, fill #2

## 2021-08-05 NOTE — Progress Notes (Signed)
[facility]                                      [address], [place], [place], [address]                                               Phn. [phone]; Fax: [phone]                                                               Date: 08/05/21 Reason for Visit: Follow up on Hepatitis C    HPI: Jerry Bradley is a 71 y.o.old male with h/o HTN, asthma, RT AKA, Ex smoker and prior IVDU who is here for fu for Hepatitis C. Spoke with the help of phone interpreter.   Discussed that HCV RNA are positive and need for additional labs for treatment planning including US abdomen to which he agrees. He follows wound care for the left leg wound and it is healing well per patient report with no new concerns. He follows wound care every week for the left leg wound. Last seen by wound care 7/6 and getting keystone antibiotics for  PCR wound cx in left leg with high levels of E. coli, Klebsiella aerogenes, staphylococcus aureus and medium levels of viridans group strep ( results unavailable to me). Denies being on abtx currently. Denies fevers, chills, sweats. Denies nausea, vomiting and diarrhea.   ROS: all systems reviewed with pertinent positives and negatives as listed above  Current Outpatient Medications on File Prior to Visit  Medication Sig Dispense Refill   albuterol (PROAIR HFA) 108 (90 Base) MCG/ACT inhaler Inhale 2 puffs into the lungs once every 4 hours as needed for wheezing or shortness of breath. 8.5 g 2   amLODipine (NORVASC) 10 MG tablet Take 1 tablet (10 mg total) by mouth daily 90 tablet 2   Bismuth Tribromoph-Petrolatum (XEROFORM OCCLUSIVE GAUZE PATCH) PADS Use as directed. 25 each 1   fluticasone furoate-vilanterol (BREO ELLIPTA) 100-25 MCG/ACT AEPB Inhale 1 puff into the lungs daily. 60 each 2   HYDROcodone-acetaminophen (NORCO/VICODIN) 5-325 MG tablet Take 1-2 tablets by mouth every 4 hours as needed  for moderate pain. 30 tablet 0   No current facility-administered medications on file prior to visit.    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   Past Surgical History:  Procedure Laterality Date   ABOVE KNEE LEG AMPUTATION Right    Social History   Socioeconomic History   Marital status: Single    Spouse name: Not on file   Number of children: Not on file   Years of education: Not on file   Highest education level: Not on file  Occupational History   Not on file  Tobacco Use   Smoking status: Former    Packs/day: 1.00    Years: 45.00    Total pack years: 45.00    Types: Cigarettes    Quit date: 11/12/2020    Years since  quitting: 0.7   Smokeless tobacco: Never  Vaping Use   Vaping Use: Never used  Substance and Sexual Activity   Alcohol use: Yes    Alcohol/week: 14.0 standard drinks of alcohol    Types: 14 Cans of beer per week   Drug use: Not Currently    Types: Cocaine    Comment: one time use , 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   Sexual activity: Yes    Birth control/protection: Condom  Other Topics Concern   Not on file  Social History Narrative   Not on file   Social Determinants of Health   Financial Resource Strain: Not on file  Food Insecurity: Not on file  Transportation Needs: Not on file  Physical Activity: Not on file  Stress: Not on file  Social Connections: Not on file  Intimate Partner Violence: Not on file   Vitals BP 136/71   Pulse (!) 59   Temp 97.8 F (36.6 C) (Oral)   Wt 143 lb (64.9 kg)   SpO2 97%   BMI 21.74 kg/m    Gen:  no acute distress HEENT: [place]/AT, no scleral icterus, no pale conjunctivae, hearing normal, oral mucosa moist Neck: Supple Cardio: Regular rate and rhythm Resp: Pulmonary effort normal on room air GI: Soft, nontender, nondistended GU: MSK - no pedal edema Skin: tattoos + Left leg: wrapped in bandage C/D/I RT AKA Neuro: Grossly non focal,  awake, alert and oriented * 3 Psych: Calm, cooperative  Laboratory     Latest Ref Rng & Units 07/02/2021   11:17 AM 06/09/2021    5:16 AM 06/07/2021    6:09 AM  CBC  WBC 3.4 - 10.8 x10E3/uL 10.8  11.0  8.9   Hemoglobin 13.0 - 17.7 g/dL 11.4  10.6  10.0   Hematocrit 37.5 - 51.0 % 34.5  32.6  31.2   Platelets 150 - 450 x10E3/uL 551  700  681       Latest Ref Rng & Units 07/02/2021   11:17 AM 06/09/2021    5:16 AM 06/07/2021    6:09 AM  CMP  Glucose 70 - 99 mg/dL 88  106  91   BUN 8 - 27 mg/dL 13  14  8    Creatinine 0.76 - 1.27 mg/dL 0.74  0.77  0.72   Sodium 134 - 144 mmol/L 136  134  136   Potassium 3.5 - 5.2 mmol/L 4.5  4.0  4.0   Chloride 96 - 106 mmol/L 102  105  109   CO2 20 - 29 mmol/L 21  22  22    Calcium 8.6 - 10.2 mg/dL 9.1  8.3  8.0   Total Protein 6.0 - 8.5 g/dL 7.7  6.9  6.2   Total Bilirubin 0.0 - 1.2 mg/dL 0.2  0.5  0.4   Alkaline Phos 44 - 121 IU/L 63  45  38   AST 0 - 40 IU/L 35  24  21   ALT 0 - 44 IU/L 28  21  23     Problem List Items Addressed This Visit       Digestive   Chronic hepatitis C without hepatic coma ([place]) - Primary   Relevant Orders   Protime-INR (Completed)   Liver Fibrosis, FibroTest-ActiTest (Completed)   US ABDOMEN COMPLETE W/ELASTOGRAPHY   Hepatitis C genotype (Completed)     Other   Cellulitis of left lower extremity   Telephone language interpreter service required   Assessment/Plan: # Chronic Hepatitis C - Labs today - US abdomen  w elastography - Fu to be made for HCV tx pending labs today   # Left leg wound improving Follows wound care  # Smoking/IVDU Has already quit  Counseled on continued cessation   # Language Barrier  - used telephone interpreter services  I have personally spent 35 minutes involved in face-to-face and non-face-to-face activities for this patient on the day of the visit. Professional time spent includes the following activities: Preparing to see the patient (review of tests), Obtaining and/or  reviewing separately obtained history (admission/discharge record), Performing a medically appropriate examination and/or evaluation , Ordering medications/tests/procedures, referring and communicating with other health care professionals, Documenting clinical information in the EMR, Independently interpreting results (not separately reported), Communicating results to the patient/family/caregiver, Counseling and educating the patient/family/caregiver and Care coordination (not separately reported).   Patients questions were addressed and answered.   Electronically signed by:  Odette Fraction, MD Infectious Diseases  Office phone [phone] Fax no. [phone]

## 2021-08-07 ENCOUNTER — Encounter (HOSPITAL_BASED_OUTPATIENT_CLINIC_OR_DEPARTMENT_OTHER): Payer: Self-pay | Admitting: Internal Medicine

## 2021-08-07 DIAGNOSIS — L97929 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 with unspecified severity: Secondary | ICD-10-CM
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
## 2021-08-08 LAB — LIVER FIBROSIS, FIBROTEST-ACTITEST
ALT: 26 U/L (ref 9–46)
Alpha-2-Macroglobulin: 246 mg/dL (ref 106–279)
Apolipoprotein A1: 159 mg/dL (ref 94–176)
Bilirubin: 0.3 mg/dL (ref 0.2–1.2)
Fibrosis Score: 0.34
GGT: 64 U/L (ref 3–70)
Haptoglobin: 162 mg/dL (ref 43–212)
Necroinflammat ACT Score: 0.13
Reference ID: 4454066

## 2021-08-08 LAB — PROTIME-INR
INR: 0.9
Prothrombin Time: 9.8 s (ref 9.0–11.5)

## 2021-08-08 LAB — HEPATITIS C GENOTYPE: HCV Genotype: 3

## 2021-08-08 NOTE — Progress Notes (Signed)
JONATHIN, HEINICKE ([id number]) Visit Report for 08/07/2021 Arrival Information Details Patient Name: Date of Service: [name] 08/07/2021 11:15 A M Medical Record Number: [id number] Patient Account Number: [id number] Date of Birth/Sex: Treating RN: [birth year] (71 y.o. [name], Tammi Klippel Primary Care Leib Elahi: Asencion Noble Other Clinician: Referring Percy Comp: Treating Axavier Pressley/Extender: Madelynn Done in Treatment: 3 Visit Information History Since Last Visit Added or deleted any medications: No Patient Arrived: [name] Any new allergies or adverse reactions: No Arrival Time: 11:51 Had a fall or experienced change in No Accompanied By: son activities of daily living that may affect Transfer Assistance: None risk of falls: Patient Identification Verified: Yes Signs or symptoms of abuse/neglect since last visito No Secondary Verification Process Completed: Yes Hospitalized since last visit: No Patient Requires Transmission-Based Precautions: No Implantable device outside of the clinic excluding No Patient Has Alerts: Yes cellular tissue based products placed in the center Patient Alerts: Translator Required since last visit: Has Dressing in Place as Prescribed: Yes Pain Present Now: No Electronic Signature(s) Signed: 08/07/2021 4:37:01 PM By: Erenest Blank Entered By: Erenest Blank on 08/07/2021 11:52:55 -------------------------------------------------------------------------------- Compression Therapy Details Patient Name: Date of Service: [name] 08/07/2021 11:15 A M Medical Record Number: [id number] Patient Account Number: [id number] Date of Birth/Sex: Treating RN: 06-13-1950 (71 y.o. Hessie Diener Primary Care Samreet [place]: Asencion Noble Other Clinician: Referring Cornelious Bartolucci: Treating Mescal Flinchbaugh/Extender: Madelynn Done in Treatment: 3 Compression Therapy Performed for Wound Assessment: Wound #1  Left,Anterior Lower Leg Performed By: Clinician Deon Pilling, RN Compression Type: Three Layer Post Procedure Diagnosis Same as Pre-procedure Electronic Signature(s) Signed: 08/07/2021 4:34:49 PM By: Deon Pilling RN, BSN Entered By: Deon Pilling on 08/07/2021 12:15:21 -------------------------------------------------------------------------------- Encounter Discharge Information Details Patient Name: Date of Service: [name], [name] 08/07/2021 11:15 A M Medical Record Number: [id number] Patient Account Number: [id number] Date of Birth/Sex: Treating RN: July 01, 1950 (71 y.o. Hessie Diener Primary Care Rydge [place]: Asencion Noble Other Clinician: Referring Cornell Gaber: Treating Mishka Stegemann/Extender: Madelynn Done in Treatment: 3 Encounter Discharge Information Items Post Procedure Vitals Discharge Condition: Stable Temperature (F): 98 Ambulatory Status: Ambulatory Pulse (bpm): 61 Discharge Destination: Home Respiratory Rate (breaths/min): 20 Transportation: Private Auto Blood Pressure (mmHg): 119/64 Accompanied By: son Schedule Follow-up Appointment: Yes Clinical Summary of Care: Electronic Signature(s) Signed: 08/07/2021 4:34:49 PM By: Deon Pilling RN, BSN Entered By: Deon Pilling on 08/07/2021 12:18:39 -------------------------------------------------------------------------------- Lower Extremity Assessment Details Patient Name: Date of Service: [name] 08/07/2021 11:15 A M Medical Record Number: [id number] Patient Account Number: [id number] Date of Birth/Sex: Treating RN: 30-Mar-1950 (71 y.o. Burnadette Pop, Lauren Primary Care Taurus Willis: Asencion Noble Other Clinician: Referring Jrue Jarriel: Treating Mumtaz Lovins/Extender: Madelynn Done in Treatment: 3 Edema Assessment Assessed: [name]: Yes] [name]: No] Edema: [Left: N] [Right: o] Calf Left: Right: Point of Measurement: 34 cm From Medial Instep 32  cm Ankle Left: Right: Point of Measurement: 11 cm From Medial Instep 22.4 cm Vascular Assessment Pulses: Dorsalis Pedis Palpable: [Left:Yes] Electronic Signature(s) Signed: 08/08/2021 11:57:18 AM By: Rhae Hammock RN Entered By: Rhae Hammock on 08/07/2021 12:09:41 -------------------------------------------------------------------------------- Multi Wound Chart Details Patient Name: Date of Service: [name] 08/07/2021 11:15 A M Medical Record Number: [id number] Patient Account Number: [id number] Date of Birth/Sex: Treating RN: 05/20/1950 (71 y.o. Hessie Diener Primary Care Hye Trawick: Asencion Noble Other Clinician: Referring Inge Waldroup: Treating Nakshatra Klose/Extender: Madelynn Done in Treatment: 3 Vital Signs Height(in): 65 Pulse(bpm): 61 Weight(lbs): Blood  Pressure(mmHg): 119/64 Body Mass Index(BMI): Temperature(F): 98 Respiratory Rate(breaths/min): 18 Photos: [N/A:N/A] Left, Anterior Lower Leg N/A N/A Wound Location: Gradually Appeared N/A N/A Wounding Event: Venous Leg Ulcer N/A N/A Primary Etiology: Asthma, Chronic Obstructive N/A N/A Comorbid History: Pulmonary Disease (COPD), Hypertension, Hepatitis C 05/26/2021 N/A N/A Date Acquired: 3 N/A N/A Weeks of Treatment: Open N/A N/A Wound Status: No N/A N/A Wound Recurrence: Yes N/A N/A Clustered Wound: 4 N/A N/A Clustered Quantity: 9.4x3x0.2 N/A N/A Measurements L x W x D (cm) 22.148 N/A N/A A (cm) : rea 4.43 N/A N/A Volume (cm) : 46.80% N/A N/A % Reduction in A rea: -6.30% N/A N/A % Reduction in Volume: Full Thickness Without Exposed N/A N/A Classification: Support Structures Large N/A N/A Exudate A mount: Serosanguineous N/A N/A Exudate Type: red, brown N/A N/A Exudate Color: Distinct, outline attached N/A N/A Wound Margin: Small (1-33%) N/A N/A Granulation A mount: Pink N/A N/A Granulation Quality: Large (67-100%) N/A N/A Necrotic A  mount: Fat Layer (Subcutaneous Tissue): Yes N/A N/A Exposed Structures: Fascia: No Tendon: No Muscle: No Joint: No Bone: No Small (1-33%) N/A N/A Epithelialization: Debridement - Excisional N/A N/A Debridement: Pre-procedure Verification/Time Out 12:05 N/A N/A Taken: Lidocaine 4% Topical Solution N/A N/A Pain Control: Subcutaneous, Slough N/A N/A Tissue Debrided: Skin/Subcutaneous Tissue N/A N/A Level: 21.25 N/A N/A Debridement A (sq cm): rea Curette N/A N/A Instrument: Minimum N/A N/A Bleeding: 0 N/A N/A Procedural Pain: 0 N/A N/A Post Procedural Pain: Procedure was tolerated well N/A N/A Debridement Treatment Response: 9.4x3x0.2 N/A N/A Post Debridement Measurements L x W x D (cm) 4.43 N/A N/A Post Debridement Volume: (cm) Compression Therapy N/A N/A Procedures Performed: Debridement Treatment Notes Wound #1 (Lower Leg) Wound Laterality: Left, Anterior Cleanser Soap and Water Discharge Instruction: May shower and wash wound with dial antibacterial soap and water prior to dressing change. Peri-Wound Care Triamcinolone 15 (g) Discharge Instruction: Use triamcinolone 15 (g) as directed Sween Lotion (Moisturizing lotion) Discharge Instruction: Apply moisturizing lotion as directed Topical Keystone compounding topical antibiotics Discharge Instruction: apply under iodoflex. Primary Dressing IODOFLEX 0.9% Cadexomer Iodine Pad 4x6 cm Discharge Instruction: Apply to wound bed as instructed Secondary Dressing ABD Pad, 8x10 Discharge Instruction: Apply over primary dressing as directed. Woven Gauze Sponge, Non-Sterile 4x4 in Discharge Instruction: Apply over primary dressing as directed. Zetuvit Plus 4x8 in Discharge Instruction: Apply over primary dressing as directed. Secured With Compression Wrap ThreePress (3 layer compression wrap) Discharge Instruction: Apply three layer compression as directed. Compression Stockings Add-Ons Electronic  Signature(s) Signed: 08/07/2021 1:08:16 PM By: Kalman Shan DO Signed: 08/07/2021 4:34:49 PM By: Deon Pilling RN, BSN Entered By: Kalman Shan on 08/07/2021 12:21:42 -------------------------------------------------------------------------------- [place] Details Patient Name: Date of Service: [name], [name] 08/07/2021 11:15 A M Medical Record Number: [id number] Patient Account Number: [id number] Date of Birth/Sex: Treating RN: 1950/04/23 (71 y.o. Hessie Diener Primary Care Roque Schill: Asencion Noble Other Clinician: Referring Romon Devereux: Treating Martinique Pizzimenti/Extender: Madelynn Done in Treatment: 3 Active Inactive Necrotic Tissue Nursing Diagnoses: Impaired tissue integrity related to necrotic/devitalized tissue Goals: Necrotic/devitalized tissue will be minimized in the wound bed Date Initiated: 07/17/2021 Target Resolution Date: 08/01/2021 Goal Status: Active Patient/caregiver will verbalize understanding of reason and process for debridement of necrotic tissue Date Initiated: 07/17/2021 Target Resolution Date: 08/01/2021 Goal Status: Active Interventions: Assess patient pain level pre-, during and post procedure and prior to discharge Treatment Activities: Apply topical anesthetic as ordered : 07/17/2021 Excisional debridement : 07/17/2021 Notes: Pain, Acute or Chronic Nursing  Diagnoses: Pain, acute or chronic: actual or potential Potential alteration in comfort, pain Goals: Patient will verbalize adequate pain control and receive pain control interventions during procedures as needed Date Initiated: 07/17/2021 Target Resolution Date: 07/31/2021 Goal Status: Active Patient/caregiver will verbalize comfort level met Date Initiated: 07/17/2021 Target Resolution Date: 08/01/2021 Goal Status: Active Interventions: Encourage patient to take pain medications as prescribed Provide education on pain management Reposition patient  for comfort Treatment Activities: Administer pain control measures as ordered : 07/17/2021 Notes: Wound/Skin Impairment Nursing Diagnoses: Knowledge deficit related to ulceration/compromised skin integrity Goals: Patient will demonstrate a reduced rate of smoking or cessation of smoking Date Initiated: 07/17/2021 Target Resolution Date: 08/01/2021 Goal Status: Active Patient/caregiver will verbalize understanding of skin care regimen Date Initiated: 07/17/2021 Target Resolution Date: 08/01/2021 Goal Status: Active Interventions: Assess patient/caregiver ability to perform ulcer/skin care regimen upon admission and as needed Assess ulceration(s) every visit Provide education on smoking Provide education on ulcer and skin care Treatment Activities: Skin care regimen initiated : 07/17/2021 Smoking cessation education : 07/17/2021 Topical wound management initiated : 07/17/2021 Notes: Electronic Signature(s) Signed: 08/07/2021 4:34:49 PM By: Deon Pilling RN, BSN Entered By: Deon Pilling on 08/07/2021 12:17:43 -------------------------------------------------------------------------------- Pain Assessment Details Patient Name: Date of Service: [name] 08/07/2021 11:15 A M Medical Record Number: [id number] Patient Account Number: [id number] Date of Birth/Sex: Treating RN: [birth year] (71 y.o. Hessie Diener Primary Care Devaeh Amadi: Other Clinician: Asencion Noble Referring Janiel Derhammer: Treating Caylie Sandquist/Extender: Madelynn Done in Treatment: 3 Active Problems Location of Pain Severity and Description of Pain Patient Has Paino No Site Locations Duration of the Pain. Constant / Intermittento Intermittent Character of Pain Describe the Pain: Shooting Pain Management and Medication Current Pain Management: Electronic Signature(s) Signed: 08/07/2021 4:34:49 PM By: Deon Pilling RN, BSN Signed: 08/07/2021 4:37:01 PM By: Erenest Blank Entered By:  Erenest Blank on 08/07/2021 11:54:36 -------------------------------------------------------------------------------- Patient/Caregiver Education Details Patient Name: Date of Service: [name] 7/13/2023andnbsp11:15 A M Medical Record Number: [id number] Patient Account Number: [id number] Date of Birth/Gender: Treating RN: Aug 10, 1950 (71 y.o. Hessie Diener Primary Care Physician: Asencion Noble Other Clinician: Referring Physician: Treating Physician/Extender: Madelynn Done in Treatment: 3 Education Assessment Education Provided To: Patient and Caregiver son Education Topics Provided Wound/Skin Impairment: Handouts: Skin Care Do's and Dont's Methods: Explain/Verbal Responses: Reinforcements needed Electronic Signature(s) Signed: 08/07/2021 4:34:49 PM By: Deon Pilling RN, BSN Entered By: Deon Pilling on 08/07/2021 12:17:55 -------------------------------------------------------------------------------- Wound Assessment Details Patient Name: Date of Service: [name] 08/07/2021 11:15 A M Medical Record Number: [id number] Patient Account Number: [id number] Date of Birth/Sex: Treating RN: Dec 04, 1950 (71 y.o. Burnadette Pop, Lauren Primary Care Harrol Novello: Asencion Noble Other Clinician: Referring Kimberle Stanfill: Treating Clarke Peretz/Extender: Madelynn Done in Treatment: 3 Wound Status Wound Number: 1 Primary Venous Leg Ulcer Etiology: Wound Location: Left, Anterior Lower Leg Wound Open Wounding Event: Gradually Appeared Status: Date Acquired: 05/26/2021 Comorbid Asthma, Chronic Obstructive Pulmonary Disease (COPD), Weeks Of Treatment: 3 History: Hypertension, Hepatitis C Clustered Wound: Yes Photos Wound Measurements Length: (cm) 9.4 Width: (cm) 3 Depth: (cm) 0.2 Clustered Quantity: 4 Area: (cm) 22.148 Volume: (cm) 4.43 % Reduction in Area: 46.8% % Reduction in Volume:  -6.3% Epithelialization: Small (1-33%) Tunneling: No Undermining: No Wound Description Classification: Full Thickness Without Exposed Support Structures Wound Margin: Distinct, outline attached Exudate Amount: Large Exudate Type: Serosanguineous Exudate Color: red, brown Foul Odor After Cleansing: No Slough/Fibrino Yes Wound Bed Granulation Amount: Small (1-33%) Exposed Structure  Granulation Quality: Pink Fascia Exposed: No Necrotic Amount: Large (67-100%) Fat Layer (Subcutaneous Tissue) Exposed: Yes Necrotic Quality: Adherent Slough Tendon Exposed: No Muscle Exposed: No Joint Exposed: No Bone Exposed: No Treatment Notes Wound #1 (Lower Leg) Wound Laterality: Left, Anterior Cleanser Soap and Water Discharge Instruction: May shower and wash wound with dial antibacterial soap and water prior to dressing change. Peri-Wound Care Triamcinolone 15 (g) Discharge Instruction: Use triamcinolone 15 (g) as directed Sween Lotion (Moisturizing lotion) Discharge Instruction: Apply moisturizing lotion as directed Topical Keystone compounding topical antibiotics Discharge Instruction: apply under iodoflex. Primary Dressing IODOFLEX 0.9% Cadexomer Iodine Pad 4x6 cm Discharge Instruction: Apply to wound bed as instructed Secondary Dressing ABD Pad, 8x10 Discharge Instruction: Apply over primary dressing as directed. Woven Gauze Sponge, Non-Sterile 4x4 in Discharge Instruction: Apply over primary dressing as directed. Zetuvit Plus 4x8 in Discharge Instruction: Apply over primary dressing as directed. Secured With Compression Wrap ThreePress (3 layer compression wrap) Discharge Instruction: Apply three layer compression as directed. Compression Stockings Add-Ons Electronic Signature(s) Signed: 08/08/2021 11:57:18 AM By: Rhae Hammock RN Entered By: Rhae Hammock on 08/07/2021 12:10:13 -------------------------------------------------------------------------------- Vitals  Details Patient Name: Date of Service: [name], [name] 08/07/2021 11:15 A M Medical Record Number: [id number] Patient Account Number: [id number] Date of Birth/Sex: Treating RN: September 21, 1950 (71 y.o. [name], Meta.Reding Primary Care Yanis Juma: Asencion Noble Other Clinician: Referring Tahj Lindseth: Treating Griffin Dewilde/Extender: Madelynn Done in Treatment: 3 Vital Signs Time Taken: 11:54 Temperature (F): 98 Height (in): 65 Pulse (bpm): 61 Respiratory Rate (breaths/min): 18 Blood Pressure (mmHg): 119/64 Reference Range: 80 - 120 mg / dl Electronic Signature(s) Signed: 08/07/2021 4:37:01 PM By: Erenest Blank Entered By: Erenest Blank on 08/07/2021 11:54:23

## 2021-08-12 ENCOUNTER — Ambulatory Visit (HOSPITAL_COMMUNITY): Payer: Self-pay

## 2021-08-14 ENCOUNTER — Encounter (HOSPITAL_BASED_OUTPATIENT_CLINIC_OR_DEPARTMENT_OTHER): Payer: Self-pay | Admitting: Internal Medicine

## 2021-08-14 DIAGNOSIS — L97929 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 with unspecified severity: Secondary | ICD-10-CM
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
## 2021-08-14 NOTE — Progress Notes (Signed)
AMON, COSTILLA ([id number]) Visit Report for 08/14/2021 Arrival Information Details Patient Name: Date of Service: Jerry Bradley 08/14/2021 11:15 A M Medical Record Number: [id number] Patient Account Number: [id number] Date of Birth/Sex: Treating RN: 03/22/1950 (71 y.o. M) Primary Care Jerry Bradley: Jerry Bradley Other Clinician: Referring Jerry Bradley: Treating Jerry Bradley/Extender: Jerry Bradley in Treatment: 4 Visit Information History Since Last Visit Added or deleted any medications: No Patient Arrived: Jerry Bradley Any new allergies or adverse reactions: No Arrival Time: 11:45 Had a fall or experienced change in No Accompanied By: daughter activities of daily living that may affect Transfer Assistance: None risk of falls: Patient Identification Verified: Yes Signs or symptoms of abuse/neglect since last visito No Secondary Verification Process Completed: Yes Hospitalized since last visit: No Patient Requires Transmission-Based Precautions: No Implantable device outside of the clinic excluding No Patient Has Alerts: Yes cellular tissue based products placed in the center Patient Alerts: Translator Required since last visit: Has Compression in Place as Prescribed: Yes Pain Present Now: Yes Electronic Signature(s) Signed: 08/14/2021 4:23:23 PM By: Jerry Bradley Entered By: Jerry Bradley on 08/14/2021 12:02:30 -------------------------------------------------------------------------------- Compression Therapy Details Patient Name: Date of Service: Jerry Bradley 08/14/2021 11:15 A M Medical Record Number: [id number] Patient Account Number: [id number] Date of Birth/Sex: Treating RN: 04-12-1950 (71 y.o. Jerry Bradley Primary Care Jerry Bradley: Jerry Bradley Other Clinician: Referring Taiana Temkin: Treating Jerry Bradley/Extender: Jerry Bradley in Treatment: 4 Compression Therapy Performed for Wound Assessment: Wound #1  Left,Anterior Lower Leg Performed By: Clinician Jerry Pilling, RN Compression Type: Three Layer Post Procedure Diagnosis Same as Pre-procedure Electronic Signature(s) Signed: 08/14/2021 5:46:02 PM By: Jerry Pilling RN, BSN Entered By: Jerry Bradley on 08/14/2021 12:11:46 -------------------------------------------------------------------------------- Encounter Discharge Information Details Patient Name: Date of Service: Jerry Bradley, Jerry Bradley Bradley 08/14/2021 11:15 A M Medical Record Number: [id number] Patient Account Number: [id number] Date of Birth/Sex: Treating RN: 07-13-1950 (71 y.o. Jerry Bradley Primary Care Jerry Bradley: Jerry Bradley Other Clinician: Referring Lynnsie Linders: Treating Jerry Bradley/Extender: Jerry Bradley in Treatment: 4 Encounter Discharge Information Items Post Procedure Vitals Discharge Condition: Stable Temperature (F): 98.3 Ambulatory Status: Ambulatory Pulse (bpm): 63 Discharge Destination: Home Respiratory Rate (breaths/min): 18 Transportation: Private Auto Blood Pressure (mmHg): 138/83 Accompanied By: daughter in law Schedule Follow-up Appointment: Yes Clinical Summary of Care: Electronic Signature(s) Signed: 08/14/2021 5:46:02 PM By: Jerry Pilling RN, BSN Entered By: Jerry Bradley on 08/14/2021 12:14:55 -------------------------------------------------------------------------------- Lower Extremity Assessment Details Patient Name: Date of Service: Jerry Bradley 08/14/2021 11:15 A M Medical Record Number: [id number] Patient Account Number: [id number] Date of Birth/Sex: Treating RN: Dec 06, 1950 (71 y.o. M) Primary Care Jerry Bradley: Jerry Bradley Other Clinician: Referring Jerry Bradley: Treating Jerry Bradley/Extender: Jerry Bradley in Treatment: 4 Edema Assessment Assessed: Jerry Bradley: No] Jerry Bradley: No] Edema: [Left: N] [Right: o] Calf Left: Right: Point of Measurement: 34 cm From Medial Instep 32  cm Ankle Left: Right: Point of Measurement: 11 cm From Medial Instep 22.5 cm Electronic Signature(s) Signed: 08/14/2021 4:23:23 PM By: Jerry Bradley Entered By: Jerry Bradley on 08/14/2021 12:02:48 -------------------------------------------------------------------------------- Multi Wound Chart Details Patient Name: Date of Service: Jerry Bradley 08/14/2021 11:15 A M Medical Record Number: [id number] Patient Account Number: [id number] Date of Birth/Sex: Treating RN: [birth year] (72 y.o. M) Primary Care Jerry Bradley: Jerry Bradley Other Clinician: Referring Mehran Guderian: Treating Jerry Bradley/Extender: Jerry Bradley in Treatment: 4 Vital Signs Height(in): 65 Pulse(bpm): 43 Weight(lbs): Blood Pressure(mmHg): 138/83 Body Mass Index(BMI): Temperature(F): 98.3 Respiratory Rate(breaths/min): 18 Photos: [N/A:N/A]  Left, Anterior Lower Leg N/A N/A Wound Location: Gradually Appeared N/A N/A Wounding Event: Venous Leg Ulcer N/A N/A Primary Etiology: Asthma, Chronic Obstructive N/A N/A Comorbid History: Pulmonary Disease (COPD), Hypertension, Hepatitis C 05/26/2021 N/A N/A Date Acquired: 4 N/A N/A Weeks of Treatment: Open N/A N/A Wound Status: No N/A N/A Wound Recurrence: Yes N/A N/A Clustered Wound: 4 N/A N/A Clustered Quantity: 9.1x3.1x0.2 N/A N/A Measurements L x W x D (cm) 22.156 N/A N/A A (cm) : rea 4.431 N/A N/A Volume (cm) : 46.80% N/A N/A % Reduction in A rea: -6.40% N/A N/A % Reduction in Volume: Full Thickness Without Exposed N/A N/A Classification: Support Structures Large N/A N/A Exudate A mount: Serosanguineous N/A N/A Exudate Type: red, brown N/A N/A Exudate Color: Distinct, outline attached N/A N/A Wound Margin: Small (1-33%) N/A N/A Granulation A mount: Pink N/A N/A Granulation Quality: Large (67-100%) N/A N/A Necrotic A mount: Fat Layer (Subcutaneous Tissue): Yes N/A N/A Exposed Structures: Fascia:  No Tendon: No Muscle: No Joint: No Bone: No Small (1-33%) N/A N/A Epithelialization: Debridement - Excisional N/A N/A Debridement: Pre-procedure Verification/Time Out 12:05 N/A N/A Taken: Lidocaine 5% topical ointment N/A N/A Pain Control: Subcutaneous, Slough N/A N/A Tissue Debrided: Skin/Subcutaneous Tissue N/A N/A Level: 20 N/A N/A Debridement A (sq cm): rea Curette N/A N/A Instrument: Minimum N/A N/A Bleeding: Pressure N/A N/A Hemostasis A chieved: 0 N/A N/A Procedural Pain: 0 N/A N/A Post Procedural Pain: Procedure was tolerated well N/A N/A Debridement Treatment Response: 9.1x3.1x0.2 N/A N/A Post Debridement Measurements L x W x D (cm) 4.431 N/A N/A Post Debridement Volume: (cm) Compression Therapy N/A N/A Procedures Performed: Debridement Treatment Notes Wound #1 (Lower Leg) Wound Laterality: Left, Anterior Cleanser Soap and Water Discharge Instruction: May shower and wash wound with dial antibacterial soap and water prior to dressing change. Peri-Wound Care Triamcinolone 15 (g) Discharge Instruction: Use triamcinolone 15 (g) as directed Sween Lotion (Moisturizing lotion) Discharge Instruction: Apply moisturizing lotion as directed Topical Keystone compounding topical antibiotics Discharge Instruction: apply under iodoflex. Primary Dressing Hydrofera Blue Classic Foam, 4x4 in Discharge Instruction: Moisten with saline prior to applying to wound bed Secondary Dressing ABD Pad, 8x10 Discharge Instruction: Apply over primary dressing as directed. Woven Gauze Sponge, Non-Sterile 4x4 in Discharge Instruction: Apply over primary dressing as directed. Secured With Compression Wrap ThreePress (3 layer compression wrap) Discharge Instruction: Apply three layer compression as directed. Compression Stockings Add-Ons Electronic Signature(s) Signed: 08/14/2021 2:00:38 PM By: Kalman Shan DO Entered By: Kalman Shan on 08/14/2021  12:49:19 -------------------------------------------------------------------------------- Multi-Disciplinary Care Plan Details Patient Name: Date of Service: Jerry Bradley, Jerry Bradley Bradley 08/14/2021 11:15 A M Medical Record Number: [id number] Patient Account Number: [id number] Date of Birth/Sex: Treating RN: 1950-08-20 (71 y.o. Jerry Bradley Primary Care Chayna Surratt: Jerry Bradley Other Clinician: Referring Bettylee Feig: Treating Gracen Southwell/Extender: Jerry Bradley in Treatment: 4 Active Inactive Necrotic Tissue Nursing Diagnoses: Impaired tissue integrity related to necrotic/devitalized tissue Goals: Necrotic/devitalized tissue will be minimized in the wound bed Date Initiated: 07/17/2021 Target Resolution Date: 08/22/2021 Goal Status: Active Patient/caregiver will verbalize understanding of reason and process for debridement of necrotic tissue Date Initiated: 07/17/2021 Target Resolution Date: 08/22/2021 Goal Status: Active Interventions: Assess patient pain level pre-, during and post procedure and prior to discharge Treatment Activities: Apply topical anesthetic as ordered : 07/17/2021 Excisional debridement : 07/17/2021 Notes: Pain, Acute or Chronic Nursing Diagnoses: Pain, acute or chronic: actual or potential Potential alteration in comfort, pain Goals: Patient will verbalize adequate pain control and receive pain control interventions  during procedures as needed Date Initiated: 07/17/2021 Target Resolution Date: 08/22/2021 Goal Status: Active Patient/caregiver will verbalize comfort level met Date Initiated: 07/17/2021 Date Inactivated: 08/14/2021 Target Resolution Date: 08/14/2021 Goal Status: Met Interventions: Encourage patient to take pain medications as prescribed Provide education on pain management Reposition patient for comfort Treatment Activities: Administer pain control measures as ordered : 07/17/2021 Notes: Wound/Skin Impairment Nursing  Diagnoses: Knowledge deficit related to ulceration/compromised skin integrity Goals: Patient will demonstrate a reduced rate of smoking or cessation of smoking Date Initiated: 07/17/2021 Target Resolution Date: 08/22/2021 Goal Status: Active Patient/caregiver will verbalize understanding of skin care regimen Date Initiated: 07/17/2021 Target Resolution Date: 08/22/2021 Goal Status: Active Interventions: Assess patient/caregiver ability to perform ulcer/skin care regimen upon admission and as needed Assess ulceration(s) every visit Provide education on smoking Provide education on ulcer and skin care Treatment Activities: Skin care regimen initiated : 07/17/2021 Smoking cessation education : 07/17/2021 Topical wound management initiated : 07/17/2021 Notes: Electronic Signature(s) Signed: 08/14/2021 5:46:02 PM By: Jerry Pilling RN, BSN Entered By: Jerry Bradley on 08/14/2021 12:12:16 -------------------------------------------------------------------------------- Pain Assessment Details Patient Name: Date of Service: Jerry Bradley 08/14/2021 11:15 A M Medical Record Number: [id number] Patient Account Number: [id number] Date of Birth/Sex: Treating RN: 1950/05/16 (71 y.o. M) Primary Care Valen Mascaro: Jerry Bradley Other Clinician: Referring Jaymar Loeber: Treating Yailen Zemaitis/Extender: Jerry Bradley in Treatment: 4 Active Problems Location of Pain Severity and Description of Pain Patient Has Paino No Patient Has Paino No Site Locations Duration of the Pain. Constant / Intermittento Intermittent Character of Pain Describe the Pain: Other: uncomfortable feeling Pain Management and Medication Current Pain Management: Electronic Signature(s) Signed: 08/14/2021 4:23:23 PM By: Jerry Bradley Entered By: Jerry Bradley on 08/14/2021 11:46:36 -------------------------------------------------------------------------------- Patient/Caregiver Education  Details Patient Name: Date of Service: Jerry Bradley 7/20/2023andnbsp11:15 A M Medical Record Number: [id number] Patient Account Number: [id number] Date of Birth/Gender: Treating RN: [birth year] (72 y.o. Jerry Bradley Primary Care Physician: Jerry Bradley Other Clinician: Referring Physician: Treating Physician/Extender: Jerry Bradley in Treatment: 4 Education Assessment Education Provided To: Patient Education Topics Provided Wound/Skin Impairment: Handouts: Skin Care Do's and Dont's Methods: Explain/Verbal Responses: Reinforcements needed Electronic Signature(s) Signed: 08/14/2021 5:46:02 PM By: Jerry Pilling RN, BSN Entered By: Jerry Bradley on 08/14/2021 12:12:30 -------------------------------------------------------------------------------- Wound Assessment Details Patient Name: Date of Service: Jerry Bradley 08/14/2021 11:15 A M Medical Record Number: [id number] Patient Account Number: [id number] Date of Birth/Sex: Treating RN: 02-Mar-1950 (71 y.o. M) Primary Care Yarenis Cerino: Jerry Bradley Other Clinician: Referring Ramiyah Mcclenahan: Treating Lileigh Fahringer/Extender: Jerry Bradley in Treatment: 4 Wound Status Wound Number: 1 Primary Venous Leg Ulcer Etiology: Wound Location: Left, Anterior Lower Leg Wound Open Wounding Event: Gradually Appeared Status: Date Acquired: 05/26/2021 Comorbid Asthma, Chronic Obstructive Pulmonary Disease (COPD), Weeks Of Treatment: 4 History: Hypertension, Hepatitis C Clustered Wound: Yes Photos Wound Measurements Length: (cm) 9.1 Width: (cm) 3.1 Depth: (cm) 0.2 Clustered Quantity: 4 Area: (cm) 22.156 Volume: (cm) 4.431 % Reduction in Area: 46.8% % Reduction in Volume: -6.4% Epithelialization: Small (1-33%) Tunneling: No Undermining: No Wound Description Classification: Full Thickness Without Exposed Support Structures Wound Margin: Distinct, outline  attached Exudate Amount: Large Exudate Type: Serosanguineous Exudate Color: red, brown Foul Odor After Cleansing: No Slough/Fibrino Yes Wound Bed Granulation Amount: Small (1-33%) Exposed Structure Granulation Quality: Pink Fascia Exposed: No Necrotic Amount: Large (67-100%) Fat Layer (Subcutaneous Tissue) Exposed: Yes Necrotic Quality: Adherent Slough Tendon Exposed: No Muscle Exposed: No Joint Exposed: No Bone Exposed: No  Treatment Notes Wound #1 (Lower Leg) Wound Laterality: Left, Anterior Cleanser Soap and Water Discharge Instruction: May shower and wash wound with dial antibacterial soap and water prior to dressing change. Peri-Wound Care Triamcinolone 15 (g) Discharge Instruction: Use triamcinolone 15 (g) as directed Sween Lotion (Moisturizing lotion) Discharge Instruction: Apply moisturizing lotion as directed Topical Keystone compounding topical antibiotics Discharge Instruction: apply under iodoflex. Primary Dressing Hydrofera Blue Classic Foam, 4x4 in Discharge Instruction: Moisten with saline prior to applying to wound bed Secondary Dressing ABD Pad, 8x10 Discharge Instruction: Apply over primary dressing as directed. Woven Gauze Sponge, Non-Sterile 4x4 in Discharge Instruction: Apply over primary dressing as directed. Secured With Compression Wrap ThreePress (3 layer compression wrap) Discharge Instruction: Apply three layer compression as directed. Compression Stockings Add-Ons Electronic Signature(s) Signed: 08/14/2021 4:23:23 PM By: Jerry Bradley Entered By: Jerry Bradley on 08/14/2021 12:05:04 -------------------------------------------------------------------------------- Vitals Details Patient Name: Date of Service: Jerry Bradley, Jerry Bradley Bradley 08/14/2021 11:15 A M Medical Record Number: [id number] Patient Account Number: [id number] Date of Birth/Sex: Treating RN: [birth year] (71 y.o. M) Primary Care Khari Mally: Jerry Bradley Other  Clinician: Referring Deryk Bozman: Treating Rolondo Pierre/Extender: Jerry Bradley in Treatment: 4 Vital Signs Time Taken: 11:48 Temperature (F): 98.3 Height (in): 65 Pulse (bpm): 63 Respiratory Rate (breaths/min): 18 Blood Pressure (mmHg): 138/83 Reference Range: 80 - 120 mg / dl Electronic Signature(s) Signed: 08/14/2021 4:23:23 PM By: Jerry Bradley Entered By: Jerry Bradley on 08/14/2021 11:48:44

## 2021-08-21 ENCOUNTER — Encounter (HOSPITAL_BASED_OUTPATIENT_CLINIC_OR_DEPARTMENT_OTHER): Payer: Self-pay | Admitting: Internal Medicine

## 2021-08-21 DIAGNOSIS — L97929 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 with unspecified severity: Secondary | ICD-10-CM
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
## 2021-08-21 NOTE — Progress Notes (Signed)
KALVEN, GANIM ([id number]) Visit Report for 08/21/2021 Arrival Information Details Patient Name: Date of Service: Jerry Bradley 08/21/2021 2:15 PM Medical Record Number: [id number] Patient Account Number: [id number] Date of Birth/Sex: Treating RN: Mar 30, 1950 (71 y.o. [name], Tammi Klippel Primary Care Kayhan Boardley: Asencion Noble Other Clinician: Referring Emmelina Mcloughlin: Treating Oleva Koo/Extender: Madelynn Done in Treatment: 5 Visit Information History Since Last Visit Added or deleted any medications: No Patient Arrived: [name] Any new allergies or adverse reactions: No Arrival Time: 14:21 Had a fall or experienced change in No Accompanied By: daughter in law activities of daily living that may affect Transfer Assistance: None risk of falls: Patient Identification Verified: Yes Signs or symptoms of abuse/neglect since last visito No Secondary Verification Process Completed: Yes Hospitalized since last visit: No Patient Requires Transmission-Based Precautions: No Implantable device outside of the clinic excluding No Patient Has Alerts: Yes cellular tissue based products placed in the center Patient Alerts: Translator Required since last visit: Has Dressing in Place as Prescribed: Yes Has Compression in Place as Prescribed: Yes Pain Present Now: No Electronic Signature(s) Signed: 08/21/2021 3:54:56 PM By: Erenest Blank Entered By: Erenest Blank on 08/21/2021 14:23:19 -------------------------------------------------------------------------------- Compression Therapy Details Patient Name: Date of Service: Jerry Bradley 08/21/2021 2:15 PM Medical Record Number: [id number] Patient Account Number: [id number] Date of Birth/Sex: Treating RN: Feb 23, 1950 (71 y.o. Hessie Diener Primary Care Ryenne Lynam: Asencion Noble Other Clinician: Referring Tatiyanna Lashley: Treating Shaquoya Cosper/Extender: Madelynn Done in Treatment: 5 Compression  Therapy Performed for Wound Assessment: Wound #1 Left,Anterior Lower Leg Performed By: Clinician Deon Pilling, RN Compression Type: Three Layer Post Procedure Diagnosis Same as Pre-procedure Electronic Signature(s) Signed: 08/21/2021 4:23:35 PM By: Deon Pilling RN, BSN Entered By: Deon Pilling on 08/21/2021 15:00:09 -------------------------------------------------------------------------------- Encounter Discharge Information Details Patient Name: Date of Service: Jerry Bradley, Jerry Bradley 08/21/2021 2:15 PM Medical Record Number: [id number] Patient Account Number: [id number] Date of Birth/Sex: Treating RN: [birth year] (71 y.o. Hessie Diener Primary Care Adaja Wander: Asencion Noble Other Clinician: Referring Luca Burston: Treating Braleigh Massoud/Extender: Madelynn Done in Treatment: 5 Encounter Discharge Information Items Post Procedure Vitals Discharge Condition: Stable Temperature (F): 97.5 Ambulatory Status: Cane Pulse (bpm): 65 Discharge Destination: Home Respiratory Rate (breaths/min): 20 Transportation: Private Auto Blood Pressure (mmHg): 142/63 Accompanied By: family Schedule Follow-up Appointment: Yes Clinical Summary of Care: Electronic Signature(s) Signed: 08/21/2021 4:23:35 PM By: Deon Pilling RN, BSN Entered By: Deon Pilling on 08/21/2021 15:03:23 -------------------------------------------------------------------------------- Lower Extremity Assessment Details Patient Name: Date of Service: Jerry Bradley 08/21/2021 2:15 PM Medical Record Number: [id number] Patient Account Number: [id number] Date of Birth/Sex: Treating RN: Sep 23, 1950 (71 y.o. Hessie Diener Primary Care Roderica Cathell: Asencion Noble Other Clinician: Referring Marquee Fuchs: Treating Amiera Herzberg/Extender: Madelynn Done in Treatment: 5 Edema Assessment Assessed: [name]: No] [name]: No] Edema: [Left: N] [Right: o] Calf Left: Right: Point of  Measurement: 34 cm From Medial Instep 31.4 cm Ankle Left: Right: Point of Measurement: 11 cm From Medial Instep 22.1 cm Knee To Floor Left: Right: From Medial Instep 46 cm Vascular Assessment Pulses: Dorsalis Pedis Palpable: [Left:Yes] Electronic Signature(s) Signed: 08/21/2021 4:23:35 PM By: Deon Pilling RN, BSN Entered By: Deon Pilling on 08/21/2021 15:01:32 -------------------------------------------------------------------------------- Multi Wound Chart Details Patient Name: Date of Service: Jerry Bradley 08/21/2021 2:15 PM Medical Record Number: [id number] Patient Account Number: [id number] Date of Birth/Sex: Treating RN: [birth year] (70 y.o. Hessie Diener Primary Care Jessice Madill: Asencion Noble Other Clinician: Referring Tishana Clinkenbeard: Treating Sena Hoopingarner/Extender: [place]  Shanon Rosser, Lurline Del in Treatment: 5 Vital Signs Height(in): 7 Pulse(bpm): 80 Weight(lbs): Blood Pressure(mmHg): 142/63 Body Mass Index(BMI): Temperature(F): 97.5 Respiratory Rate(breaths/min): 18 Photos: [N/A:N/A] Left, Anterior Lower Leg N/A N/A Wound Location: Gradually Appeared N/A N/A Wounding Event: Venous Leg Ulcer N/A N/A Primary Etiology: Asthma, Chronic Obstructive N/A N/A Comorbid History: Pulmonary Disease (COPD), Hypertension, Hepatitis C 05/26/2021 N/A N/A Date Acquired: 5 N/A N/A Weeks of Treatment: Open N/A N/A Wound Status: No N/A N/A Wound Recurrence: Yes N/A N/A Clustered Wound: 4 N/A N/A Clustered Quantity: 8.7x3x0.2 N/A N/A Measurements L x W x D (cm) 20.499 N/A N/A A (cm) : rea 4.1 N/A N/A Volume (cm) : 50.80% N/A N/A % Reduction in A rea: 1.60% N/A N/A % Reduction in Volume: Full Thickness Without Exposed N/A N/A Classification: Support Structures Large N/A N/A Exudate A mount: Serosanguineous N/A N/A Exudate Type: red, brown N/A N/A Exudate Color: Distinct, outline attached N/A N/A Wound Margin: Small (1-33%) N/A  N/A Granulation A mount: Pink N/A N/A Granulation Quality: Large (67-100%) N/A N/A Necrotic A mount: Fat Layer (Subcutaneous Tissue): Yes N/A N/A Exposed Structures: Fascia: No Tendon: No Muscle: No Joint: No Bone: No Small (1-33%) N/A N/A Epithelialization: Debridement - Excisional N/A N/A Debridement: Pre-procedure Verification/Time Out 14:55 N/A N/A Taken: Lidocaine 4% Topical Solution N/A N/A Pain Control: Subcutaneous, Slough N/A N/A Tissue Debrided: Skin/Subcutaneous Tissue N/A N/A Level: 8 N/A N/A Debridement A (sq cm): rea Curette N/A N/A Instrument: Minimum N/A N/A Bleeding: Pressure N/A N/A Hemostasis A chieved: 0 N/A N/A Procedural Pain: 0 N/A N/A Post Procedural Pain: Procedure was tolerated well N/A N/A Debridement Treatment Response: 8.7x3x0.2 N/A N/A Post Debridement Measurements L x W x D (cm) 4.1 N/A N/A Post Debridement Volume: (cm) Compression Therapy N/A N/A Procedures Performed: Debridement Treatment Notes Wound #1 (Lower Leg) Wound Laterality: Left, Anterior Cleanser Soap and Water Discharge Instruction: May shower and wash wound with dial antibacterial soap and water prior to dressing change. Peri-Wound Care Triamcinolone 15 (g) Discharge Instruction: Use triamcinolone 15 (g) as directed Sween Lotion (Moisturizing lotion) Discharge Instruction: Apply moisturizing lotion as directed Topical Keystone compounding topical antibiotics Discharge Instruction: apply under iodoflex. Primary Dressing Hydrofera Blue Classic Foam, 4x4 in Discharge Instruction: Moisten with saline prior to applying to mid and distal wound bed IODOFLEX 0.9% Cadexomer Iodine Pad 4x6 cm Discharge Instruction: Apply to anterior portion of upper wound bed as instructed Secondary Dressing ABD Pad, 8x10 Discharge Instruction: Apply over primary dressing as directed. Secured With Compression Wrap ThreePress (3 layer compression wrap) Discharge Instruction:  Apply three layer compression as directed. Compression Stockings Add-Ons Electronic Signature(s) Signed: 08/21/2021 4:01:46 PM By: Kalman Shan DO Signed: 08/21/2021 4:23:35 PM By: Deon Pilling RN, BSN Entered By: Kalman Shan on 08/21/2021 15:25:46 -------------------------------------------------------------------------------- Multi-Disciplinary Care Plan Details Patient Name: Date of Service: Jerry Bradley, Jerry Bradley 08/21/2021 2:15 PM Medical Record Number: [id number] Patient Account Number: [id number] Date of Birth/Sex: Treating RN: [birth year] (71 y.o. Hessie Diener Primary Care Dorothey Oetken: Asencion Noble Other Clinician: Referring Daylynn Stumpp: Treating Ruari Mudgett/Extender: Madelynn Done in Treatment: 5 Active Inactive Necrotic Tissue Nursing Diagnoses: Impaired tissue integrity related to necrotic/devitalized tissue Goals: Necrotic/devitalized tissue will be minimized in the wound bed Date Initiated: 07/17/2021 Target Resolution Date: 09/19/2021 Goal Status: Active Patient/caregiver will verbalize understanding of reason and process for debridement of necrotic tissue Date Initiated: 07/17/2021 Target Resolution Date: 09/19/2021 Goal Status: Active Interventions: Assess patient pain level pre-, during and post procedure and prior to discharge Treatment  Activities: Apply topical anesthetic as ordered : 07/17/2021 Excisional debridement : 07/17/2021 Notes: Pain, Acute or Chronic Nursing Diagnoses: Pain, acute or chronic: actual or potential Potential alteration in comfort, pain Goals: Patient will verbalize adequate pain control and receive pain control interventions during procedures as needed Date Initiated: 07/17/2021 Target Resolution Date: 09/19/2021 Goal Status: Active Patient/caregiver will verbalize comfort level met Date Initiated: 07/17/2021 Date Inactivated: 08/14/2021 Target Resolution Date: 08/14/2021 Goal Status:  Met Interventions: Encourage patient to take pain medications as prescribed Provide education on pain management Reposition patient for comfort Treatment Activities: Administer pain control measures as ordered : 07/17/2021 Notes: Wound/Skin Impairment Nursing Diagnoses: Knowledge deficit related to ulceration/compromised skin integrity Goals: Patient will demonstrate a reduced rate of smoking or cessation of smoking Date Initiated: 07/17/2021 Target Resolution Date: 09/09/2021 Goal Status: Active Patient/caregiver will verbalize understanding of skin care regimen Date Initiated: 07/17/2021 Target Resolution Date: 09/11/2021 Goal Status: Active Interventions: Assess patient/caregiver ability to perform ulcer/skin care regimen upon admission and as needed Assess ulceration(s) every visit Provide education on smoking Provide education on ulcer and skin care Treatment Activities: Skin care regimen initiated : 07/17/2021 Smoking cessation education : 07/17/2021 Topical wound management initiated : 07/17/2021 Notes: Electronic Signature(s) Signed: 08/21/2021 4:23:35 PM By: Deon Pilling RN, BSN Entered By: Deon Pilling on 08/21/2021 14:23:59 -------------------------------------------------------------------------------- Pain Assessment Details Patient Name: Date of Service: Jerry Bradley 08/21/2021 2:15 PM Medical Record Number: [id number] Patient Account Number: [id number] Date of Birth/Sex: Treating RN: 10/04/1950 (71 y.o. Hessie Diener Primary Care Yvetta Drotar: Asencion Noble Other Clinician: Referring Kimi Bordeau: Treating Tiondra Fang/Extender: Madelynn Done in Treatment: 5 Active Problems Location of Pain Severity and Description of Pain Patient Has Paino No Site Locations Pain Management and Medication Current Pain Management: Electronic Signature(s) Signed: 08/21/2021 3:54:56 PM By: Erenest Blank Signed: 08/21/2021 4:23:35 PM By: Deon Pilling RN, BSN Entered By: Erenest Blank on 08/21/2021 14:25:19 -------------------------------------------------------------------------------- Patient/Caregiver Education Details Patient Name: Date of Service: Jerry Bradley 7/27/2023andnbsp2:15 PM Medical Record Number: [id number] Patient Account Number: [id number] Date of Birth/Gender: Treating RN: [birth year] (71 y.o. Hessie Diener Primary Care Physician: Asencion Noble Other Clinician: Referring Physician: Treating Physician/Extender: Madelynn Done in Treatment: 5 Education Assessment Education Provided To: Patient Education Topics Provided Wound/Skin Impairment: Handouts: Skin Care Do's and Dont's, Skin Care Do's and Dont's Spanish Methods: Explain/Verbal Responses: Reinforcements needed Electronic Signature(s) Signed: 08/21/2021 4:23:35 PM By: Deon Pilling RN, BSN Entered By: Deon Pilling on 08/21/2021 14:24:12 -------------------------------------------------------------------------------- Wound Assessment Details Patient Name: Date of Service: Jerry Bradley 08/21/2021 2:15 PM Medical Record Number: [id number] Patient Account Number: [id number] Date of Birth/Sex: Treating RN: September 17, 1950 (70 y.o. [name], Meta.Reding Primary Care Shekina Cordell: Asencion Noble Other Clinician: Referring Mikhai Bienvenue: Treating Ramelo Oetken/Extender: Madelynn Done in Treatment: 5 Wound Status Wound Number: 1 Primary Venous Leg Ulcer Etiology: Wound Location: Left, Anterior Lower Leg Wound Open Wounding Event: Gradually Appeared Status: Date Acquired: 05/26/2021 Comorbid Asthma, Chronic Obstructive Pulmonary Disease (COPD), Weeks Of Treatment: 5 History: Hypertension, Hepatitis C Clustered Wound: Yes Photos Wound Measurements Length: (cm) 8.7 Width: (cm) 3 Depth: (cm) 0.2 Clustered Quantity: 4 Area: (cm) 20.499 Volume: (cm) 4.1 % Reduction in Area: 50.8% % Reduction  in Volume: 1.6% Epithelialization: Small (1-33%) Tunneling: No Undermining: No Wound Description Classification: Full Thickness Without Exposed Support Structures Wound Margin: Distinct, outline attached Exudate Amount: Large Exudate Type: Serosanguineous Exudate Color: red, brown Foul Odor After Cleansing: No Slough/Fibrino Yes Wound Bed  Granulation Amount: Small (1-33%) Exposed Structure Granulation Quality: Pink Fascia Exposed: No Necrotic Amount: Large (67-100%) Fat Layer (Subcutaneous Tissue) Exposed: Yes Necrotic Quality: Adherent Slough Tendon Exposed: No Muscle Exposed: No Joint Exposed: No Bone Exposed: No Treatment Notes Wound #1 (Lower Leg) Wound Laterality: Left, Anterior Cleanser Soap and Water Discharge Instruction: May shower and wash wound with dial antibacterial soap and water prior to dressing change. Peri-Wound Care Triamcinolone 15 (g) Discharge Instruction: Use triamcinolone 15 (g) as directed Sween Lotion (Moisturizing lotion) Discharge Instruction: Apply moisturizing lotion as directed Topical [employer] compounding topical antibiotics Discharge Instruction: apply under iodoflex. Primary Dressing Hydrofera Blue Classic Foam, 4x4 in Discharge Instruction: Moisten with saline prior to applying to mid and distal wound bed IODOFLEX 0.9% Cadexomer Iodine Pad 4x6 cm Discharge Instruction: Apply to anterior portion of upper wound bed as instructed Secondary Dressing ABD Pad, 8x10 Discharge Instruction: Apply over primary dressing as directed. Secured With Compression Wrap ThreePress (3 layer compression wrap) Discharge Instruction: Apply three layer compression as directed. Compression Stockings Add-Ons Electronic Signature(s) Signed: 08/21/2021 3:54:56 PM By: Erenest Blank Signed: 08/21/2021 4:23:35 PM By: Deon Pilling RN, BSN Entered By: Erenest Blank on 08/21/2021  14:39:28 -------------------------------------------------------------------------------- Vitals Details Patient Name: Date of Service: Jerry Bradley, Jerry Bradley 08/21/2021 2:15 PM Medical Record Number: [id number] Patient Account Number: [id number] Date of Birth/Sex: Treating RN: 10-Apr-1950 (71 y.o. [name], Meta.Reding Primary Care Consandra Laske: Asencion Noble Other Clinician: Referring Alexy Bringle: Treating Nashaly Dorantes/Extender: Madelynn Done in Treatment: 5 Vital Signs Time Taken: 14:23 Temperature (F): 97.5 Height (in): 65 Pulse (bpm): 65 Respiratory Rate (breaths/min): 18 Blood Pressure (mmHg): 142/63 Reference Range: 80 - 120 mg / dl Electronic Signature(s) Signed: 08/21/2021 3:54:56 PM By: Erenest Blank Entered By: Erenest Blank on 08/21/2021 14:25:12

## 2021-08-28 ENCOUNTER — Encounter (HOSPITAL_BASED_OUTPATIENT_CLINIC_OR_DEPARTMENT_OTHER): Payer: Self-pay | Attending: Internal Medicine | Admitting: Internal Medicine

## 2021-08-28 DIAGNOSIS — L97929 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 with unspecified severity: Secondary | ICD-10-CM | POA: Insufficient documentation

## 2021-08-28 DIAGNOSIS — I10 Essential (primary) hypertension: Secondary | ICD-10-CM | POA: Insufficient documentation

## 2021-08-28 DIAGNOSIS — Z89611 Acquired absence of right leg above knee: Secondary | ICD-10-CM | POA: Insufficient documentation

## 2021-08-28 DIAGNOSIS — I87312 Chronic venous hypertension (idiopathic) with ulcer of left lower extremity: Secondary | ICD-10-CM | POA: Insufficient documentation

## 2021-08-28 DIAGNOSIS — B182 Chronic viral hepatitis C: Secondary | ICD-10-CM | POA: Insufficient documentation
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
## 2021-08-28 NOTE — Progress Notes (Signed)
Jerry Bradley, Jerry Bradley ([id number]) Visit Report for 08/28/2021 Arrival Information Details Patient Name: Date of Service: Jerry Bradley UE 08/28/2021 11:15 A M Medical Record Number: [id number] Patient Account Number: [id number] Date of Birth/Sex: Treating RN: November 05, 1950 (71 y.o. Jerry Bradley, Tammi Klippel Primary Care Fortune Torosian: Asencion Noble Other Clinician: Referring Khylie Larmore: Treating Dvid Pendry/Extender: Madelynn Done in Treatment: 6 Visit Information History Since Last Visit Added or deleted any medications: No Patient Arrived: [name] Any new allergies or adverse reactions: No Arrival Time: 11:21 Had a fall or experienced change in No Accompanied By: son activities of daily living that may affect Transfer Assistance: None risk of falls: Patient Identification Verified: Yes Signs or symptoms of abuse/neglect since last visito No Secondary Verification Process Completed: Yes Hospitalized since last visit: No Patient Requires Transmission-Based Precautions: No Implantable device outside of the clinic excluding No Patient Has Alerts: Yes cellular tissue based products placed in the center Patient Alerts: Translator Required since last visit: Has Dressing in Place as Prescribed: Yes Has Compression in Place as Prescribed: Yes Pain Present Now: No Electronic Signature(s) Signed: 08/28/2021 4:49:08 PM By: Erenest Blank Entered By: Erenest Blank on 08/28/2021 11:22:29 -------------------------------------------------------------------------------- Compression Therapy Details Patient Name: Date of Service: Jerry Bradley UE 08/28/2021 11:15 A M Medical Record Number: [id number] Patient Account Number: [id number] Date of Birth/Sex: Treating RN: [birth year] (70 y.o. Hessie Diener Primary Care Daltyn Degroat: Asencion Noble Other Clinician: Referring Dillon Livermore: Treating Sapna Padron/Extender: Madelynn Done in Treatment: 6 Compression Therapy  Performed for Wound Assessment: Wound #1 Left,Anterior Lower Leg Performed By: Clinician Deon Pilling, RN Compression Type: Three Layer Post Procedure Diagnosis Same as Pre-procedure Electronic Signature(s) Signed: 08/28/2021 4:21:29 PM By: Deon Pilling RN, BSN Entered By: Deon Pilling on 08/28/2021 11:44:00 -------------------------------------------------------------------------------- Encounter Discharge Information Details Patient Name: Date of Service: Jerry Bradley UE 08/28/2021 11:15 A M Medical Record Number: [id number] Patient Account Number: [id number] Date of Birth/Sex: Treating RN: [birth year] (71 y.o. Hessie Diener Primary Care Garv Kuechle: Asencion Noble Other Clinician: Referring Michaelpaul Apo: Treating Annaelle Kasel/Extender: Madelynn Done in Treatment: 6 Encounter Discharge Information Items Post Procedure Vitals Discharge Condition: Stable Temperature (F): 97.7 Ambulatory Status: Cane Pulse (bpm): 62 Discharge Destination: Home Respiratory Rate (breaths/min): 20 Transportation: Private Auto Blood Pressure (mmHg): 137/63 Accompanied By: son Schedule Follow-up Appointment: Yes Clinical Summary of Care: Electronic Signature(s) Signed: 08/28/2021 4:21:29 PM By: Deon Pilling RN, BSN Entered By: Deon Pilling on 08/28/2021 12:04:47 -------------------------------------------------------------------------------- Lower Extremity Assessment Details Patient Name: Date of Service: Jerry Bradley UE 08/28/2021 11:15 A M Medical Record Number: [id number] Patient Account Number: [id number] Date of Birth/Sex: Treating RN: 1950-01-29 (71 y.o. Hessie Diener Primary Care Lucian Baswell: Asencion Noble Other Clinician: Referring Saksham Akkerman: Treating Pattiann Solanki/Extender: Madelynn Done in Treatment: 6 Edema Assessment Assessed: [name]: No] [name]: No] Edema: [Left: N] [Right: o] Calf Left: Right: Point of Measurement: 34 cm  From Medial Instep 31 cm Ankle Left: Right: Point of Measurement: 11 cm From Medial Instep 22 cm Electronic Signature(s) Signed: 08/28/2021 4:21:29 PM By: Deon Pilling RN, BSN Signed: 08/28/2021 4:49:08 PM By: Erenest Blank Entered By: Erenest Blank on 08/28/2021 11:37:17 -------------------------------------------------------------------------------- Multi Wound Chart Details Patient Name: Date of Service: Jerry Bradley UE 08/28/2021 11:15 A M Medical Record Number: [id number] Patient Account Number: [id number] Date of Birth/Sex: Treating RN: [birth year] (71 y.o. Hessie Diener Primary Care Sudeep Scheibel: Asencion Noble Other Clinician: Referring Trevor Duty: Treating Alexas Basulto/Extender: Madelynn Done in Treatment: 6  Vital Signs Height(in): 65 Pulse(bpm): 35 Weight(lbs): Blood Pressure(mmHg): 137/63 Body Mass Index(BMI): Temperature(F): 97.7 Respiratory Rate(breaths/min): 18 Photos: [N/A:N/A] Left, Anterior Lower Leg N/A N/A Wound Location: Gradually Appeared N/A N/A Wounding Event: Venous Leg Ulcer N/A N/A Primary Etiology: Asthma, Chronic Obstructive N/A N/A Comorbid History: Pulmonary Disease (COPD), Hypertension, Hepatitis C 05/26/2021 N/A N/A Date Acquired: 6 N/A N/A Weeks of Treatment: Open N/A N/A Wound Status: No N/A N/A Wound Recurrence: Yes N/A N/A Clustered Wound: 4 N/A N/A Clustered Quantity: 8.5x1.9x0.2 N/A N/A Measurements L x W x D (cm) 12.684 N/A N/A A (cm) : rea 2.537 N/A N/A Volume (cm) : 69.60% N/A N/A % Reduction in A rea: 39.10% N/A N/A % Reduction in Volume: Full Thickness Without Exposed N/A N/A Classification: Support Structures Large N/A N/A Exudate A mount: Serosanguineous N/A N/A Exudate Type: red, brown N/A N/A Exudate Color: Distinct, outline attached N/A N/A Wound Margin: Small (1-33%) N/A N/A Granulation A mount: Pink N/A N/A Granulation Quality: Large (67-100%) N/A N/A Necrotic A  mount: Fat Layer (Subcutaneous Tissue): Yes N/A N/A Exposed Structures: Fascia: No Tendon: No Muscle: No Joint: No Bone: No Small (1-33%) N/A N/A Epithelialization: Debridement - Excisional N/A N/A Debridement: Pre-procedure Verification/Time Out 11:55 N/A N/A Taken: Lidocaine 5% topical ointment N/A N/A Pain Control: Subcutaneous, Slough N/A N/A Tissue Debrided: Skin/Subcutaneous Tissue N/A N/A Level: 7.5 N/A N/A Debridement A (sq cm): rea Curette N/A N/A Instrument: Minimum N/A N/A Bleeding: Pressure N/A N/A Hemostasis A chieved: 0 N/A N/A Procedural Pain: 0 N/A N/A Post Procedural Pain: Procedure was tolerated well N/A N/A Debridement Treatment Response: 8.5x1.9x0.2 N/A N/A Post Debridement Measurements L x W x D (cm) 2.537 N/A N/A Post Debridement Volume: (cm) Compression Therapy N/A N/A Procedures Performed: Debridement Treatment Notes Wound #1 (Lower Leg) Wound Laterality: Left, Anterior Cleanser Soap and Water Discharge Instruction: May shower and wash wound with dial antibacterial soap and water prior to dressing change. Peri-Wound Care Triamcinolone 15 (g) Discharge Instruction: Use triamcinolone 15 (g) as directed Sween Lotion (Moisturizing lotion) Discharge Instruction: Apply moisturizing lotion as directed Topical Primary Dressing Hydrofera Blue Classic Foam, 4x4 in Discharge Instruction: Moisten with saline prior to applying to mid and distal wound bed Santyl Ointment Discharge Instruction: Apply nickel thick amount to wound bed as instructed Secondary Dressing ABD Pad, 8x10 Discharge Instruction: Apply over primary dressing as directed. Secured With Compression Wrap ThreePress (3 layer compression wrap) Discharge Instruction: Apply three layer compression as directed. Compression Stockings Add-Ons Electronic Signature(s) Signed: 08/28/2021 2:50:56 PM By: Kalman Shan DO Signed: 08/28/2021 4:21:29 PM By: Deon Pilling RN,  BSN Entered By: Kalman Shan on 08/28/2021 12:28:34 -------------------------------------------------------------------------------- [place] Details Patient Name: Date of Service: Jerry Bradley UE 08/28/2021 11:15 A M Medical Record Number: [id number] Patient Account Number: [id number] Date of Birth/Sex: Treating RN: Aug 02, 1950 (71 y.o. Hessie Diener Primary Care Leverne Tessler: Asencion Noble Other Clinician: Referring Akin Yi: Treating Limuel Nieblas/Extender: Madelynn Done in Treatment: 6 Active Inactive Necrotic Tissue Nursing Diagnoses: Impaired tissue integrity related to necrotic/devitalized tissue Goals: Necrotic/devitalized tissue will be minimized in the wound bed Date Initiated: 07/17/2021 Target Resolution Date: 09/19/2021 Goal Status: Active Patient/caregiver will verbalize understanding of reason and process for debridement of necrotic tissue Date Initiated: 07/17/2021 Target Resolution Date: 09/19/2021 Goal Status: Active Interventions: Assess patient pain level pre-, during and post procedure and prior to discharge Treatment Activities: Apply topical anesthetic as ordered : 07/17/2021 Excisional debridement : 07/17/2021 Notes: Pain, Acute or Chronic Nursing Diagnoses: Pain, acute  or chronic: actual or potential Potential alteration in comfort, pain Goals: Patient will verbalize adequate pain control and receive pain control interventions during procedures as needed Date Initiated: 07/17/2021 Target Resolution Date: 09/19/2021 Goal Status: Active Patient/caregiver will verbalize comfort level met Date Initiated: 07/17/2021 Date Inactivated: 08/14/2021 Target Resolution Date: 08/14/2021 Goal Status: Met Interventions: Encourage patient to take pain medications as prescribed Provide education on pain management Reposition patient for comfort Treatment Activities: Administer pain control measures as ordered :  07/17/2021 Notes: Wound/Skin Impairment Nursing Diagnoses: Knowledge deficit related to ulceration/compromised skin integrity Goals: Patient will demonstrate a reduced rate of smoking or cessation of smoking Date Initiated: 07/17/2021 Target Resolution Date: 09/26/2021 Goal Status: Active Patient/caregiver will verbalize understanding of skin care regimen Date Initiated: 07/17/2021 Target Resolution Date: 09/26/2021 Goal Status: Active Interventions: Assess patient/caregiver ability to perform ulcer/skin care regimen upon admission and as needed Assess ulceration(s) every visit Provide education on smoking Provide education on ulcer and skin care Treatment Activities: Skin care regimen initiated : 07/17/2021 Smoking cessation education : 07/17/2021 Topical wound management initiated : 07/17/2021 Notes: Electronic Signature(s) Signed: 08/28/2021 4:21:29 PM By: Deon Pilling RN, BSN Entered By: Deon Pilling on 08/28/2021 11:40:30 -------------------------------------------------------------------------------- Pain Assessment Details Patient Name: Date of Service: Jerry Bradley UE 08/28/2021 11:15 A M Medical Record Number: [id number] Patient Account Number: [id number] Date of Birth/Sex: Treating RN: [birth year] (71 y.o. Hessie Diener Primary Care Cordelle Dahmen: Asencion Noble Other Clinician: Referring Norely Schlick: Treating Dane Bloch/Extender: Madelynn Done in Treatment: 6 Active Problems Location of Pain Severity and Description of Pain Patient Has Paino No Site Locations Pain Management and Medication Current Pain Management: Electronic Signature(s) Signed: 08/28/2021 4:21:29 PM By: Deon Pilling RN, BSN Signed: 08/28/2021 4:49:08 PM By: Erenest Blank Entered By: Erenest Blank on 08/28/2021 11:24:20 -------------------------------------------------------------------------------- Patient/Caregiver Education Details Patient Name: Date of Service: Jerry Bradley UE 8/3/2023andnbsp11:15 A M Medical Record Number: [id number] Patient Account Number: [id number] Date of Birth/Gender: Treating RN: 1950/10/02 (71 y.o. Hessie Diener Primary Care Physician: Asencion Noble Other Clinician: Referring Physician: Treating Physician/Extender: Madelynn Done in Treatment: 6 Education Assessment Education Provided To: Patient Education Topics Provided Pain: Handouts: A Guide to Pain Control Methods: Explain/Verbal Responses: Reinforcements needed Electronic Signature(s) Signed: 08/28/2021 4:21:29 PM By: Deon Pilling RN, BSN Entered By: Deon Pilling on 08/28/2021 11:40:39 -------------------------------------------------------------------------------- Wound Assessment Details Patient Name: Date of Service: Jerry Bradley UE 08/28/2021 11:15 A M Medical Record Number: [id number] Patient Account Number: [id number] Date of Birth/Sex: Treating RN: 1950-11-11 (71 y.o. Jerry Bradley, Meta.Reding Primary Care Arnice Vanepps: Asencion Noble Other Clinician: Referring Oriel Ojo: Treating Dustan Hyams/Extender: Madelynn Done in Treatment: 6 Wound Status Wound Number: 1 Primary Venous Leg Ulcer Etiology: Wound Location: Left, Anterior Lower Leg Wound Open Wounding Event: Gradually Appeared Status: Date Acquired: 05/26/2021 Comorbid Asthma, Chronic Obstructive Pulmonary Disease (COPD), Weeks Of Treatment: 6 History: Hypertension, Hepatitis C Clustered Wound: Yes Photos Wound Measurements Length: (cm) 8.5 Width: (cm) 1.9 Depth: (cm) 0.2 Clustered Quantity: 4 Area: (cm) 12.684 Volume: (cm) 2.537 % Reduction in Area: 69.6% % Reduction in Volume: 39.1% Epithelialization: Small (1-33%) Tunneling: No Undermining: No Wound Description Classification: Full Thickness Without Exposed Support Structures Wound Margin: Distinct, outline attached Exudate Amount: Large Exudate Type: Serosanguineous Exudate  Color: red, brown Foul Odor After Cleansing: No Slough/Fibrino Yes Wound Bed Granulation Amount: Small (1-33%) Exposed Structure Granulation Quality: Pink Fascia Exposed: No Necrotic Amount: Large (67-100%) Fat Layer (Subcutaneous Tissue) Exposed: Yes Necrotic Quality: Adherent  Slough Tendon Exposed: No Muscle Exposed: No Joint Exposed: No Bone Exposed: No Treatment Notes Wound #1 (Lower Leg) Wound Laterality: Left, Anterior Cleanser Soap and Water Discharge Instruction: May shower and wash wound with dial antibacterial soap and water prior to dressing change. Peri-Wound Care Triamcinolone 15 (g) Discharge Instruction: Use triamcinolone 15 (g) as directed Sween Lotion (Moisturizing lotion) Discharge Instruction: Apply moisturizing lotion as directed Topical Primary Dressing Hydrofera Blue Classic Foam, 4x4 in Discharge Instruction: Moisten with saline prior to applying to mid and distal wound bed Santyl Ointment Discharge Instruction: Apply nickel thick amount to wound bed as instructed Secondary Dressing ABD Pad, 8x10 Discharge Instruction: Apply over primary dressing as directed. Secured With Compression Wrap ThreePress (3 layer compression wrap) Discharge Instruction: Apply three layer compression as directed. Compression Stockings Add-Ons Electronic Signature(s) Signed: 08/28/2021 4:21:29 PM By: Deon Pilling RN, BSN Signed: 08/28/2021 4:49:08 PM By: Erenest Blank Entered By: Erenest Blank on 08/28/2021 11:39:08 -------------------------------------------------------------------------------- Vitals Details Patient Name: Date of Service: Jerry Bradley, Jerry Bradley UE 08/28/2021 11:15 A M Medical Record Number: [id number] Patient Account Number: [id number] Date of Birth/Sex: Treating RN: 05-31-1950 (71 y.o. Jerry Bradley, Meta.Reding Primary Care Averey Trompeter: Asencion Noble Other Clinician: Referring Deyana Wnuk: Treating Zarra Geffert/Extender: Madelynn Done in  Treatment: 6 Vital Signs Time Taken: 11:23 Temperature (F): 97.7 Height (in): 65 Pulse (bpm): 62 Respiratory Rate (breaths/min): 18 Blood Pressure (mmHg): 137/63 Reference Range: 80 - 120 mg / dl Electronic Signature(s) Signed: 08/28/2021 4:49:08 PM By: Erenest Blank Entered By: Erenest Blank on 08/28/2021 11:24:13

## 2021-08-29 ENCOUNTER — Other Ambulatory Visit: Payer: Self-pay

## 2021-09-04 ENCOUNTER — Encounter (HOSPITAL_BASED_OUTPATIENT_CLINIC_OR_DEPARTMENT_OTHER): Payer: Self-pay | Admitting: Internal Medicine

## 2021-09-04 DIAGNOSIS — L97929 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 with unspecified severity: Secondary | ICD-10-CM

## 2021-09-05 NOTE — Progress Notes (Signed)
MASSON, NALEPA ([id number]) Visit Report for 09/04/2021 Arrival Information Details Patient Name: Date of Service: [name] 09/04/2021 2:15 PM Medical Record Number: [id number] Patient Account Number: [id number] Date of Birth/Sex: Treating RN: 12-22-1950 (71 y.o. [name], Tammi Klippel Primary Care Brycelynn Stampley: Asencion Noble Other Clinician: Referring Maceo Hernan: Treating Aiysha Jillson/Extender: Madelynn Done in Treatment: 7 Visit Information History Since Last Visit Added or deleted any medications: No Patient Arrived: [name] Any new allergies or adverse reactions: No Arrival Time: 14:26 Had a fall or experienced change in No Accompanied By: self activities of daily living that may affect Transfer Assistance: None risk of falls: Patient Identification Verified: Yes Signs or symptoms of abuse/neglect since last visito No Secondary Verification Process Completed: Yes Hospitalized since last visit: No Patient Requires Transmission-Based Precautions: No Implantable device outside of the clinic excluding No Patient Has Alerts: Yes cellular tissue based products placed in the center Patient Alerts: Translator Required since last visit: Has Dressing in Place as Prescribed: Yes Has Compression in Place as Prescribed: Yes Pain Present Now: No Electronic Signature(s) Signed: 09/04/2021 5:06:20 PM By: Deon Pilling RN, BSN Entered By: Deon Pilling on 09/04/2021 14:28:42 -------------------------------------------------------------------------------- Compression Therapy Details Patient Name: Date of Service: [name] 09/04/2021 2:15 PM Medical Record Number: [id number] Patient Account Number: [id number] Date of Birth/Sex: Treating RN: Sep 26, 1950 (71 y.o. Hessie Diener Primary Care Nezzie Manera: Asencion Noble Other Clinician: Referring Deneane Stifter: Treating Chayce Rullo/Extender: Madelynn Done in Treatment: 7 Compression  Therapy Performed for Wound Assessment: Wound #1 Left,Anterior Lower Leg Performed By: Clinician Deon Pilling, RN Compression Type: Three Layer Post Procedure Diagnosis Same as Pre-procedure Electronic Signature(s) Signed: 09/04/2021 5:06:20 PM By: Deon Pilling RN, BSN Entered By: Deon Pilling on 09/04/2021 15:07:48 -------------------------------------------------------------------------------- Encounter Discharge Information Details Patient Name: Date of Service: [name], [name] 09/04/2021 2:15 PM Medical Record Number: [id number] Patient Account Number: [id number] Date of Birth/Sex: Treating RN: 1951-01-13 (71 y.o. Hessie Diener Primary Care Jacqueline Delapena: Asencion Noble Other Clinician: Referring Mahki Spikes: Treating Breaunna Gottlieb/Extender: Madelynn Done in Treatment: 7 Encounter Discharge Information Items Post Procedure Vitals Discharge Condition: Stable Temperature (F): 98.2 Ambulatory Status: Cane Pulse (bpm): 67 Discharge Destination: Home Respiratory Rate (breaths/min): 20 Transportation: Private Auto Blood Pressure (mmHg): 150/82 Accompanied By: self Schedule Follow-up Appointment: Yes Clinical Summary of Care: Electronic Signature(s) Signed: 09/04/2021 5:06:20 PM By: Deon Pilling RN, BSN Entered By: Deon Pilling on 09/04/2021 15:11:19 -------------------------------------------------------------------------------- Lower Extremity Assessment Details Patient Name: Date of Service: [name] 09/04/2021 2:15 PM Medical Record Number: [id number] Patient Account Number: [id number] Date of Birth/Sex: Treating RN: 09/29/1950 (71 y.o. Hessie Diener Primary Care Batool Majid: Asencion Noble Other Clinician: Referring Kaelen Brennan: Treating Danijela Vessey/Extender: Madelynn Done in Treatment: 7 Edema Assessment Assessed: [name]: Yes] [name]: No] Edema: [Left: N] [Right: o] Calf Left: Right: Point of  Measurement: 34 cm From Medial Instep 31 cm Ankle Left: Right: Point of Measurement: 11 cm From Medial Instep 22 cm Vascular Assessment Pulses: Dorsalis Pedis Palpable: [Left:Yes] Electronic Signature(s) Signed: 09/04/2021 5:06:20 PM By: Deon Pilling RN, BSN Entered By: Deon Pilling on 09/04/2021 14:34:30 -------------------------------------------------------------------------------- Multi Wound Chart Details Patient Name: Date of Service: [name] 09/04/2021 2:15 PM Medical Record Number: [id number] Patient Account Number: [id number] Date of Birth/Sex: Treating RN: 1950-05-10 (71 y.o. Hessie Diener Primary Care Philena Obey: Asencion Noble Other Clinician: Referring Arhum Peeples: Treating Shauntee Karp/Extender: Madelynn Done in Treatment: 7 Vital Signs Height(in):  70 Pulse(bpm): 1 Weight(lbs): Blood Pressure(mmHg): 150/82 Body Mass Index(BMI): Temperature(F): 98.2 Respiratory Rate(breaths/min): 16 Photos: [N/A:N/A] Left, Anterior Lower Leg N/A N/A Wound Location: Gradually Appeared N/A N/A Wounding Event: Venous Leg Ulcer N/A N/A Primary Etiology: Asthma, Chronic Obstructive N/A N/A Comorbid History: Pulmonary Disease (COPD), Hypertension, Hepatitis C 05/26/2021 N/A N/A Date Acquired: 7 N/A N/A Weeks of Treatment: Open N/A N/A Wound Status: No N/A N/A Wound Recurrence: Yes N/A N/A Clustered Wound: 2 N/A N/A Clustered Quantity: 4.7x0.7x0.2 N/A N/A Measurements L x W x D (cm) 2.584 N/A N/A A (cm) : rea 0.517 N/A N/A Volume (cm) : 93.80% N/A N/A % Reduction in A rea: 87.60% N/A N/A % Reduction in Volume: Full Thickness Without Exposed N/A N/A Classification: Support Structures Large N/A N/A Exudate A mount: Serosanguineous N/A N/A Exudate Type: red, brown N/A N/A Exudate Color: Distinct, outline attached N/A N/A Wound Margin: Large (67-100%) N/A N/A Granulation A mount: Pink N/A N/A Granulation  Quality: Small (1-33%) N/A N/A Necrotic A mount: Fat Layer (Subcutaneous Tissue): Yes N/A N/A Exposed Structures: Fascia: No Tendon: No Muscle: No Joint: No Bone: No Large (67-100%) N/A N/A Epithelialization: Debridement - Excisional N/A N/A Debridement: Pre-procedure Verification/Time Out 15:00 N/A N/A Taken: Lidocaine 5% topical ointment N/A N/A Pain Control: Subcutaneous, Slough N/A N/A Tissue Debrided: Skin/Subcutaneous Tissue N/A N/A Level: 3.29 N/A N/A Debridement A (sq cm): rea Curette N/A N/A Instrument: Minimum N/A N/A Bleeding: Pressure N/A N/A Hemostasis A chieved: 0 N/A N/A Procedural Pain: 0 N/A N/A Post Procedural Pain: Procedure was tolerated well N/A N/A Debridement Treatment Response: 4.7x0.7x0.2 N/A N/A Post Debridement Measurements L x W x D (cm) 0.517 N/A N/A Post Debridement Volume: (cm) Compression Therapy N/A N/A Procedures Performed: Debridement Treatment Notes Wound #1 (Lower Leg) Wound Laterality: Left, Anterior Cleanser Soap and Water Discharge Instruction: May shower and wash wound with dial antibacterial soap and water prior to dressing change. Peri-Wound Care Triamcinolone 15 (g) Discharge Instruction: Use triamcinolone 15 (g) as directed Sween Lotion (Moisturizing lotion) Discharge Instruction: Apply moisturizing lotion as directed Topical Primary Dressing Promogran Prisma Matrix, 4.34 (sq in) (silver collagen) Discharge Instruction: Moisten collagen with saline at upper portion of wound. Hydrofera Blue Classic Foam, 4x4 in Discharge Instruction: Moisten with saline prior to applying to mid and distal wound bed at the lower portion of wound. Santyl Ointment Discharge Instruction: Apply nickel thick amount to wound bed as instructed at the lower portion of wound. Secondary Dressing ABD Pad, 8x10 Discharge Instruction: Apply over primary dressing as directed. Secured With Compression Wrap ThreePress (3 layer compression  wrap) Discharge Instruction: Apply three layer compression as directed. Compression Stockings Add-Ons Electronic Signature(s) Signed: 09/04/2021 5:06:20 PM By: Deon Pilling RN, BSN Signed: 09/05/2021 1:41:40 PM By: Kalman Shan DO Entered By: Kalman Shan on 09/04/2021 16:38:48 -------------------------------------------------------------------------------- Multi-Disciplinary Care Plan Details Patient Name: Date of Service: [name], [name] 09/04/2021 2:15 PM Medical Record Number: [id number] Patient Account Number: [id number] Date of Birth/Sex: Treating RN: 1950/10/31 (71 y.o. Hessie Diener Primary Care Ashayla Subia: Asencion Noble Other Clinician: Referring Ross Hefferan: Treating Kinlie Janice/Extender: Madelynn Done in Treatment: 7 Active Inactive Necrotic Tissue Nursing Diagnoses: Impaired tissue integrity related to necrotic/devitalized tissue Goals: Necrotic/devitalized tissue will be minimized in the wound bed Date Initiated: 07/17/2021 Target Resolution Date: 09/19/2021 Goal Status: Active Patient/caregiver will verbalize understanding of reason and process for debridement of necrotic tissue Date Initiated: 07/17/2021 Target Resolution Date: 09/19/2021 Goal Status: Active Interventions: Assess patient pain level pre-, during and post  procedure and prior to discharge Treatment Activities: Apply topical anesthetic as ordered : 07/17/2021 Excisional debridement : 07/17/2021 Notes: Pain, Acute or Chronic Nursing Diagnoses: Pain, acute or chronic: actual or potential Potential alteration in comfort, pain Goals: Patient will verbalize adequate pain control and receive pain control interventions during procedures as needed Date Initiated: 07/17/2021 Target Resolution Date: 09/19/2021 Goal Status: Active Patient/caregiver will verbalize comfort level met Date Initiated: 07/17/2021 Date Inactivated: 08/14/2021 Target Resolution Date:  08/14/2021 Goal Status: Met Interventions: Encourage patient to take pain medications as prescribed Provide education on pain management Reposition patient for comfort Treatment Activities: Administer pain control measures as ordered : 07/17/2021 Notes: Wound/Skin Impairment Nursing Diagnoses: Knowledge deficit related to ulceration/compromised skin integrity Goals: Patient will demonstrate a reduced rate of smoking or cessation of smoking Date Initiated: 07/17/2021 Target Resolution Date: 09/26/2021 Goal Status: Active Patient/caregiver will verbalize understanding of skin care regimen Date Initiated: 07/17/2021 Target Resolution Date: 09/26/2021 Goal Status: Active Interventions: Assess patient/caregiver ability to perform ulcer/skin care regimen upon admission and as needed Assess ulceration(s) every visit Provide education on smoking Provide education on ulcer and skin care Treatment Activities: Skin care regimen initiated : 07/17/2021 Smoking cessation education : 07/17/2021 Topical wound management initiated : 07/17/2021 Notes: Electronic Signature(s) Signed: 09/04/2021 5:06:20 PM By: Deon Pilling RN, BSN Entered By: Deon Pilling on 09/04/2021 14:31:17 -------------------------------------------------------------------------------- Pain Assessment Details Patient Name: Date of Service: [name] 09/04/2021 2:15 PM Medical Record Number: [id number] Patient Account Number: [id number] Date of Birth/Sex: Treating RN: 1950/10/22 (71 y.o. Hessie Diener Primary Care Rahn Lacuesta: Asencion Noble Other Clinician: Referring Pailyn Bellevue: Treating Blanchie Zeleznik/Extender: Madelynn Done in Treatment: 7 Active Problems Location of Pain Severity and Description of Pain Patient Has Paino No Site Locations Rate the pain. Current Pain Level: 0 Pain Management and Medication Current Pain Management: Medication: No Cold Application: No Rest: No Massage:  No Activity: No T.E.N.S.: No Heat Application: No Leg drop or elevation: No Is the Current Pain Management Adequate: Adequate How does your wound impact your activities of daily livingo Sleep: No Bathing: No Appetite: No Relationship With Others: No Bladder Continence: No Emotions: No Bowel Continence: No Work: No Toileting: No Drive: No Dressing: No Hobbies: No Notes per patient discomfort at times. Electronic Signature(s) Signed: 09/04/2021 5:06:20 PM By: Deon Pilling RN, BSN Entered By: Deon Pilling on 09/04/2021 14:29:16 -------------------------------------------------------------------------------- Patient/Caregiver Education Details Patient Name: Date of Service: [name] 8/10/2023andnbsp2:15 PM Medical Record Number: [id number] Patient Account Number: [id number] Date of Birth/Gender: Treating RN: [birth year] (72 y.o. Hessie Diener Primary Care Physician: Asencion Noble Other Clinician: Referring Physician: Treating Physician/Extender: Madelynn Done in Treatment: 7 Education Assessment Education Provided To: Patient Education Topics Provided Pain: Handouts: A Guide to Pain Control Methods: Explain/Verbal Responses: Reinforcements needed Electronic Signature(s) Signed: 09/04/2021 5:06:20 PM By: Deon Pilling RN, BSN Entered By: Deon Pilling on 09/04/2021 14:31:28 -------------------------------------------------------------------------------- Wound Assessment Details Patient Name: Date of Service: [name] 09/04/2021 2:15 PM Medical Record Number: [id number] Patient Account Number: [id number] Date of Birth/Sex: Treating RN: 1950-10-16 (70 y.o. Hessie Diener Primary Care Fenton Candee: Asencion Noble Other Clinician: Referring Charis Juliana: Treating Artavious Trebilcock/Extender: Madelynn Done in Treatment: 7 Wound Status Wound Number: 1 Primary Venous Leg Ulcer Etiology: Wound Location:  Left, Anterior Lower Leg Wound Open Wounding Event: Gradually Appeared Status: Date Acquired: 05/26/2021 Comorbid Asthma, Chronic Obstructive Pulmonary Disease (COPD), Weeks Of Treatment: 7 History: Hypertension, Hepatitis C Clustered Wound: Yes  Photos Wound Measurements Length: (cm) 4.7 Width: (cm) 0.7 Depth: (cm) 0.2 Clustered Quantity: 2 Area: (cm) 2.5 Volume: (cm) 0.5 % Reduction in Area: 93.8% % Reduction in Volume: 87.6% Epithelialization: Large (67-100%) Tunneling: No 84 Undermining: No 17 Wound Description Classification: Full Thickness Without Exposed Support Stru Wound Margin: Distinct, outline attached Exudate Amount: Large Exudate Type: Serosanguineous Exudate Color: red, brown ctures Foul Odor After Cleansing: No Slough/Fibrino Yes Wound Bed Granulation Amount: Large (67-100%) Exposed Structure Granulation Quality: Pink Fascia Exposed: No Necrotic Amount: Small (1-33%) Fat Layer (Subcutaneous Tissue) Exposed: Yes Necrotic Quality: Adherent Slough Tendon Exposed: No Muscle Exposed: No Joint Exposed: No Bone Exposed: No Treatment Notes Wound #1 (Lower Leg) Wound Laterality: Left, Anterior Cleanser Soap and Water Discharge Instruction: May shower and wash wound with dial antibacterial soap and water prior to dressing change. Peri-Wound Care Triamcinolone 15 (g) Discharge Instruction: Use triamcinolone 15 (g) as directed Sween Lotion (Moisturizing lotion) Discharge Instruction: Apply moisturizing lotion as directed Topical Primary Dressing Promogran Prisma Matrix, 4.34 (sq in) (silver collagen) Discharge Instruction: Moisten collagen with saline at upper portion of wound. Hydrofera Blue Classic Foam, 4x4 in Discharge Instruction: Moisten with saline prior to applying to mid and distal wound bed at the lower portion of wound. Santyl Ointment Discharge Instruction: Apply nickel thick amount to wound bed as instructed at the lower portion of  wound. Secondary Dressing ABD Pad, 8x10 Discharge Instruction: Apply over primary dressing as directed. Secured With Compression Wrap ThreePress (3 layer compression wrap) Discharge Instruction: Apply three layer compression as directed. Compression Stockings Add-Ons Electronic Signature(s) Signed: 09/04/2021 5:06:20 PM By: Deon Pilling RN, BSN Entered By: Deon Pilling on 09/04/2021 14:38:51 -------------------------------------------------------------------------------- Vitals Details Patient Name: Date of Service: [name], [name] 09/04/2021 2:15 PM Medical Record Number: [id number] Patient Account Number: [id number] Date of Birth/Sex: Treating RN: 1950/07/02 (70 y.o. [name], Meta.Reding Primary Care Bricelyn Freestone: Asencion Noble Other Clinician: Referring Kaidynce Pfister: Treating Claiborne Stroble/Extender: Madelynn Done in Treatment: 7 Vital Signs Time Taken: 14:28 Temperature (F): 98.2 Height (in): 65 Pulse (bpm): 67 Respiratory Rate (breaths/min): 16 Blood Pressure (mmHg): 150/82 Reference Range: 80 - 120 mg / dl Electronic Signature(s) Signed: 09/04/2021 5:06:20 PM By: Deon Pilling RN, BSN Entered By: Deon Pilling on 09/04/2021 14:31:47

## 2021-09-05 NOTE — Progress Notes (Signed)
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

## 2021-09-08 ENCOUNTER — Other Ambulatory Visit: Payer: Self-pay

## 2021-09-11 ENCOUNTER — Encounter (HOSPITAL_BASED_OUTPATIENT_CLINIC_OR_DEPARTMENT_OTHER): Payer: Self-pay | Admitting: General Surgery

## 2021-09-11 NOTE — Progress Notes (Signed)
ZAEDEN, LASTINGER ([id number]) Visit Report for 09/11/2021 SuperBill Details Patient Name: Date of Service: [name] 09/11/2021 Medical Record Number: [id number] Patient Account Number: [id number] Date of Birth/Sex: Treating RN: 1950/07/08 (71 y.o. 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

## 2021-09-11 NOTE — Progress Notes (Signed)
TRIPP, GOINS ([id number]) Visit Report for 09/11/2021 Arrival Information Details Patient Name: Date of Service: [name] 09/11/2021 11:00 A M Medical Record Number: [id number] Patient Account Number: [id number] Date of Birth/Sex: Treating RN: 03/05/1950 (70 y.o. M) Primary Care Hudson Majkowski: Shan Levans Other Clinician: Referring Zygmunt Mcglinn: Treating Natashia Roseman/Extender: Jacques Earthly in Treatment: 8 Visit Information History Since Last Visit Added or deleted any medications: No Patient Arrived: Gilmer Mor Any new allergies or adverse reactions: No Arrival Time: 11:23 Had a fall or experienced change in No Accompanied By: translater activities of daily living that may affect Transfer Assistance: None risk of falls: Patient Identification Verified: Yes Signs or symptoms of abuse/neglect since last visito No Patient Requires Transmission-Based Precautions: No Hospitalized since last visit: No Patient Has Alerts: Yes Implantable device outside of the clinic excluding No Patient Alerts: Translator Required cellular tissue based products placed in the center since last visit: Has Dressing in Place as Prescribed: Yes Has Compression in Place as Prescribed: Yes Pain Present Now: No Electronic Signature(s) Signed: 09/11/2021 3:34:53 PM By: Thayer Dallas Entered By: Thayer Dallas on 09/11/2021 11:23:59 -------------------------------------------------------------------------------- Compression Therapy Details Patient Name: Date of Service: [name] 09/11/2021 11:00 A M Medical Record Number: [id number] Patient Account Number: [id number] Date of Birth/Sex: Treating RN: [birth year] (71 y.o. M) Primary Care Zaliah Wissner: Shan Levans Other Clinician: Referring Liz Pinho: Treating Olar Santini/Extender: Jacques Earthly in Treatment: 8 Compression Therapy Performed for Wound Assessment: Wound #1 Left,Anterior Lower  Leg Performed By: Clinician Thayer Dallas, Compression Type: Three Layer Electronic Signature(s) Signed: 09/11/2021 3:34:53 PM By: Thayer Dallas Entered By: Thayer Dallas on 09/11/2021 11:49:55 -------------------------------------------------------------------------------- Encounter Discharge Information Details Patient Name: Date of Service: [name] 09/11/2021 11:00 A M Medical Record Number: [id number] Patient Account Number: [id number] Date of Birth/Sex: Treating RN: 02/15/1950 (71 y.o. M) Primary Care Rockney Grenz: Shan Levans Other Clinician: Thayer Dallas Referring Raghad Lorenz: Treating Aaima Gaddie/Extender: Jacques Earthly in Treatment: 8 Encounter Discharge Information Items Discharge Condition: Stable Ambulatory Status: Cane Discharge Destination: Home Transportation: Private Auto Accompanied By: self Schedule Follow-up Appointment: Yes Clinical Summary of Care: Electronic Signature(s) Signed: 09/11/2021 3:34:53 PM By: Thayer Dallas Entered By: Thayer Dallas on 09/11/2021 11:50:39 -------------------------------------------------------------------------------- Patient/Caregiver Education Details Patient Name: Date of Service: [name] 8/17/2023andnbsp11:00 A M Medical Record Number: [id number] Patient Account Number: [id number] Date of Birth/Gender: Treating RN: July 10, 1950 (71 y.o. M) Primary Care Physician: Shan Levans Other Clinician: Thayer Dallas Referring Physician: Treating Physician/Extender: Jacques Earthly in Treatment: 8 Education Assessment Education Provided To: Patient Education Topics Provided Electronic Signature(s) Signed: 09/11/2021 3:34:53 PM By: Thayer Dallas Entered By: Thayer Dallas on 09/11/2021 11:50:19 -------------------------------------------------------------------------------- Wound Assessment Details Patient Name: Date of Service: [name] 09/11/2021 11:00 A M Medical Record Number: [id number] Patient Account Number: [id number] Date of Birth/Sex: Treating RN: [birth year] (71 y.o. M) Primary Care Adayah Arocho: Shan Levans Other Clinician: Referring Ramari Bray: Treating Mohab Ashby/Extender: Jacques Earthly in Treatment: 8 Wound Status Wound Number: 1 Primary Etiology: Venous Leg Ulcer Wound Location: Left, Anterior Lower Leg Wound Status: Open Wounding Event: Gradually Appeared Date Acquired: 05/26/2021 Weeks Of Treatment: 8 Clustered Wound: Yes Wound Measurements Length: (cm) 4.7 Width: (cm) 0.7 Depth: (cm) 0.2 Area: (cm) 2.584 Volume: (cm) 0.517 % Reduction in Area: 93.8% % Reduction in Volume: 87.6% Wound Description Classification: Full Thickness Without Exposed Support Struct Exudate Amount: Large Exudate Type: Serosanguineous Exudate Color: red, brown ures  Treatment Notes Wound #1 (Lower Leg) Wound Laterality: Left, Anterior Cleanser Soap and Water Discharge Instruction: May shower and wash wound with dial antibacterial soap and water prior to dressing change. Peri-Wound Care Triamcinolone 15 (g) Discharge Instruction: Use triamcinolone 15 (g) as directed Sween Lotion (Moisturizing lotion) Discharge Instruction: Apply moisturizing lotion as directed Topical Primary Dressing Promogran Prisma Matrix, 4.34 (sq in) (silver collagen) Discharge Instruction: Moisten collagen with saline at upper portion of wound. Hydrofera Blue Classic Foam, 4x4 in Discharge Instruction: Moisten with saline prior to applying to mid and distal wound bed at the lower portion of wound. Santyl Ointment Discharge Instruction: Apply nickel thick amount to wound bed as instructed at the lower portion of wound. Secondary Dressing ABD Pad, 8x10 Discharge Instruction: Apply over primary dressing as directed. Secured With Compression Wrap ThreePress (3 layer compression wrap) Discharge Instruction:  Apply three layer compression as directed. Compression Stockings Add-Ons Electronic Signature(s) Signed: 09/11/2021 3:34:53 PM By: Thayer Dallas Entered By: Thayer Dallas on 09/11/2021 11:26:12 -------------------------------------------------------------------------------- Vitals Details Patient Name: Date of Service: [name], [name] 09/11/2021 11:00 A M Medical Record Number: [id number] Patient Account Number: [id number] Date of Birth/Sex: Treating RN: [birth year] (72 y.o. M) Primary Care Carsten Carstarphen: Shan Levans Other Clinician: Referring Jiles Goya: Treating Rahi Chandonnet/Extender: Jacques Earthly in Treatment: 8 Vital Signs Time Taken: 11:24 Temperature (F): 98 Height (in): 65 Pulse (bpm): 74 Respiratory Rate (breaths/min): 17 Blood Pressure (mmHg): 151/80 Reference Range: 80 - 120 mg / dl Electronic Signature(s) Signed: 09/11/2021 3:34:53 PM By: Thayer Dallas Entered By: Thayer Dallas on 09/11/2021 11:26:02

## 2021-09-18 ENCOUNTER — Encounter (HOSPITAL_BASED_OUTPATIENT_CLINIC_OR_DEPARTMENT_OTHER): Payer: Self-pay | Admitting: Internal Medicine

## 2021-09-18 DIAGNOSIS — L97929 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 with unspecified severity: Secondary | ICD-10-CM
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
## 2021-09-19 NOTE — Progress Notes (Signed)
[name], [name] ([id number]) Visit Report for 09/18/2021 Arrival Information Details Patient Name: Date of Service: Jerry Bradley 09/18/2021 1:30 PM Medical Record Number: [id number] Patient Account Number: [id number] Date of Birth/Sex: Treating RN: 07-Nov-1950 (71 y.o. Jerry Bradley, Jerry Bradley Primary Care Jerry Bradley: Jerry Bradley Other Clinician: Referring Jerry Bradley: Treating Jerry Bradley/Extender: Jerry Bradley in Treatment: 9 Visit Information History Since Last Visit Added or deleted any medications: No Patient Arrived: Jerry Bradley Any new allergies or adverse reactions: No Arrival Time: 13:28 Had a fall or experienced change in No Accompanied By: interpreter activities of daily living that may affect Transfer Assistance: None risk of falls: Patient Identification Verified: Yes Signs or symptoms of abuse/neglect since last visito No Secondary Verification Process Completed: Yes Hospitalized since last visit: No Patient Requires Transmission-Based Precautions: No Implantable device outside of the clinic excluding No Patient Has Alerts: Yes cellular tissue based products placed in the center Patient Alerts: Translator Required since last visit: Has Compression in Place as Prescribed: Yes Pain Present Now: No Electronic Signature(s) Signed: 09/18/2021 4:14:43 PM By: Jerry Bradley Entered By: Jerry Bradley on 09/18/2021 13:29:29 -------------------------------------------------------------------------------- Compression Therapy Details Patient Name: Date of Service: Jerry Bradley 09/18/2021 1:30 PM Medical Record Number: [id number] Patient Account Number: [id number] Date of Birth/Sex: Treating RN: 02-23-1950 (71 y.o. Jerry Bradley Primary Care Shemica Meath: Jerry Bradley Other Clinician: Referring Keniesha Adderly: Treating Lashante Fryberger/Extender: Jerry Bradley in Treatment: 9 Compression Therapy Performed for Wound Assessment: Wound  #1 Left,Anterior Lower Leg Performed By: Clinician Jerry Pilling, RN Compression Type: Three Layer Post Procedure Diagnosis Same as Pre-procedure Electronic Signature(s) Signed: 09/18/2021 3:54:54 PM By: Jerry Pilling RN, BSN Entered By: Jerry Bradley on 09/18/2021 13:57:52 -------------------------------------------------------------------------------- Encounter Discharge Information Details Patient Name: Date of Service: Jerry Bradley Bradley 09/18/2021 1:30 PM Medical Record Number: [id number] Patient Account Number: [id number] Date of Birth/Sex: Treating RN: 01-Jan-1951 (71 y.o. Jerry Bradley Primary Care Laniesha Das: Jerry Bradley Other Clinician: Referring Jerry Bradley: Treating Jerry Bradley/Extender: Jerry Bradley in Treatment: 9 Encounter Discharge Information Items Post Procedure Vitals Discharge Condition: Stable Temperature (F): 97.8 Ambulatory Status: Cane Pulse (bpm): 73 Discharge Destination: Home Respiratory Rate (breaths/min): 20 Transportation: Private Auto Blood Pressure (mmHg): 151/97 Accompanied By: interpreter Schedule Follow-up Appointment: Yes Clinical Summary of Care: Electronic Signature(s) Signed: 09/18/2021 3:54:54 PM By: Jerry Pilling RN, BSN Entered By: Jerry Bradley on 09/18/2021 14:04:02 -------------------------------------------------------------------------------- Lower Extremity Assessment Details Patient Name: Date of Service: Jerry Bradley 09/18/2021 1:30 PM Medical Record Number: [id number] Patient Account Number: [id number] Date of Birth/Sex: Treating RN: [birth year] (71 y.o. Jerry Bradley Primary Care Jerry Bradley: Jerry Bradley Other Clinician: Referring Jerry Bradley: Treating Jerry Bradley/Extender: Jerry Bradley in Treatment: 9 Edema Assessment Assessed: Jerry Bradley: No] Jerry Bradley: No] Edema: [Left: N] [Right: o] Calf Left: Right: Point of Measurement: 34 cm From Medial Instep 32.6  cm Ankle Left: Right: Point of Measurement: 11 cm From Medial Instep 21.8 cm Electronic Signature(s) Signed: 09/18/2021 3:54:54 PM By: Jerry Pilling RN, BSN Signed: 09/18/2021 4:14:43 PM By: Jerry Bradley Entered By: Jerry Bradley on 09/18/2021 13:41:10 -------------------------------------------------------------------------------- Multi Wound Chart Details Patient Name: Date of Service: Jerry Bradley Bradley 09/18/2021 1:30 PM Medical Record Number: [id number] Patient Account Number: [id number] Date of Birth/Sex: Treating RN: 1950-02-20 (71 y.o. Jerry Bradley Primary Care Jerry Bradley: Jerry Bradley Other Clinician: Referring Jerry Bradley: Treating Jerry Bradley/Extender: Jerry Bradley in Treatment: 9 Vital Signs Height(in): 65 Pulse(bpm): 46 Weight(lbs): Blood Pressure(mmHg): 151/97 Body Mass  Index(BMI): Temperature(F): 97.8 Respiratory Rate(breaths/min): 18 Photos: [N/A:N/A] Left, Anterior Lower Leg N/A N/A Wound Location: Gradually Appeared N/A N/A Wounding Event: Venous Leg Ulcer N/A N/A Primary Etiology: Asthma, Chronic Obstructive N/A N/A Comorbid History: Pulmonary Disease (COPD), Hypertension, Hepatitis C 05/26/2021 N/A N/A Date Acquired: 9 N/A N/A Weeks of Treatment: Open N/A N/A Wound Status: No N/A N/A Wound Recurrence: Yes N/A N/A Clustered Wound: 4.8x0.6x0.2 N/A N/A Measurements L x W x D (cm) 2.262 N/A N/A A (cm) : rea 0.452 N/A N/A Volume (cm) : 94.60% N/A N/A % Reduction in A rea: 89.20% N/A N/A % Reduction in Volume: Full Thickness Without Exposed N/A N/A Classification: Support Structures Large N/A N/A Exudate A mount: Serosanguineous N/A N/A Exudate Type: red, brown N/A N/A Exudate Color: Medium (34-66%) N/A N/A Granulation A mount: Medium (34-66%) N/A N/A Necrotic A mount: Small (1-33%) N/A N/A Epithelialization: Debridement - Excisional N/A N/A Debridement: Pre-procedure Verification/Time Out 13:50  N/A N/A Taken: Lidocaine 5% topical ointment N/A N/A Pain Control: Subcutaneous, Slough N/A N/A Tissue Debrided: Skin/Subcutaneous Tissue N/A N/A Level: 0.75 N/A N/A Debridement A (sq cm): rea Curette N/A N/A Instrument: Minimum N/A N/A Bleeding: 0 N/A N/A Procedural Pain: 0 N/A N/A Post Procedural Pain: Procedure was tolerated well N/A N/A Debridement Treatment Response: 4.8x0.6x0.2 N/A N/A Post Debridement Measurements L x W x D (cm) 0.452 N/A N/A Post Debridement Volume: (cm) Compression Therapy N/A N/A Procedures Performed: Debridement Treatment Notes Wound #1 (Lower Leg) Wound Laterality: Left, Anterior Cleanser Soap and Water Discharge Instruction: May shower and wash wound with dial antibacterial soap and water prior to dressing change. Peri-Wound Care Triamcinolone 15 (g) Discharge Instruction: Use triamcinolone 15 (g) as directed Sween Lotion (Moisturizing lotion) Discharge Instruction: Apply moisturizing lotion as directed Topical Primary Dressing Promogran Prisma Matrix, 4.34 (sq in) (silver collagen) Discharge Instruction: Moisten collagen with saline. Secondary Dressing Woven Gauze Sponge, Non-Sterile 4x4 in Discharge Instruction: Apply over primary dressing as directed. Secured With Compression Wrap ThreePress (3 layer compression wrap) Discharge Instruction: Apply three layer compression as directed. Compression Stockings Add-Ons Electronic Signature(s) Signed: 09/18/2021 3:54:54 PM By: Jerry Pilling RN, BSN Signed: 09/19/2021 10:24:04 AM By: Kalman Shan DO Entered By: Kalman Shan on 09/18/2021 14:42:59 -------------------------------------------------------------------------------- Multi-Disciplinary Care Plan Details Patient Name: Date of Service: Jerry Bradley Bradley 09/18/2021 1:30 PM Medical Record Number: [id number] Patient Account Number: [id number] Date of Birth/Sex: Treating RN: 1950/08/26 (71 y.o. Jerry Bradley,  Jerry Bradley Primary Care Leani Myron: Jerry Bradley Other Clinician: Referring Ethel Meisenheimer: Treating Shalane Florendo/Extender: Jerry Bradley in Treatment: 9 Active Inactive Necrotic Tissue Nursing Diagnoses: Impaired tissue integrity related to necrotic/devitalized tissue Goals: Necrotic/devitalized tissue will be minimized in the wound bed Date Initiated: 07/17/2021 Target Resolution Date: 10/10/2021 Goal Status: Active Patient/caregiver will verbalize understanding of reason and process for debridement of necrotic tissue Date Initiated: 07/17/2021 Target Resolution Date: 10/10/2021 Goal Status: Active Interventions: Assess patient pain level pre-, during and post procedure and prior to discharge Treatment Activities: Apply topical anesthetic as ordered : 07/17/2021 Excisional debridement : 07/17/2021 Notes: Pain, Acute or Chronic Nursing Diagnoses: Pain, acute or chronic: actual or potential Potential alteration in comfort, pain Goals: Patient will verbalize adequate pain control and receive pain control interventions during procedures as needed Date Initiated: 07/17/2021 Target Resolution Date: 10/10/2021 Goal Status: Active Patient/caregiver will verbalize comfort level met Date Initiated: 07/17/2021 Date Inactivated: 08/14/2021 Target Resolution Date: 08/14/2021 Goal Status: Met Interventions: Encourage patient to take pain medications as prescribed Provide education on pain management Reposition patient  for comfort Treatment Activities: Administer pain control measures as ordered : 07/17/2021 Notes: Wound/Skin Impairment Nursing Diagnoses: Knowledge deficit related to ulceration/compromised skin integrity Goals: Patient will demonstrate a reduced rate of smoking or cessation of smoking Date Initiated: 07/17/2021 Target Resolution Date: 09/26/2021 Goal Status: Active Patient/caregiver will verbalize understanding of skin care regimen Date Initiated:  07/17/2021 Target Resolution Date: 09/26/2021 Goal Status: Active Interventions: Assess patient/caregiver ability to perform ulcer/skin care regimen upon admission and as needed Assess ulceration(s) every visit Provide education on smoking Provide education on ulcer and skin care Treatment Activities: Skin care regimen initiated : 07/17/2021 Smoking cessation education : 07/17/2021 Topical wound management initiated : 07/17/2021 Notes: Electronic Signature(s) Signed: 09/18/2021 3:54:54 PM By: Jerry Pilling RN, BSN Entered By: Jerry Bradley on 09/18/2021 13:31:44 -------------------------------------------------------------------------------- Pain Assessment Details Patient Name: Date of Service: Jerry Bradley 09/18/2021 1:30 PM Medical Record Number: [id number] Patient Account Number: [id number] Date of Birth/Sex: Treating RN: 1950/11/09 (71 y.o. Jerry Bradley Primary Care Alta Shober: Jerry Bradley Other Clinician: Referring Metta Koranda: Treating Ishaaq Penna/Extender: Jerry Bradley in Treatment: 9 Active Problems Location of Pain Severity and Description of Pain Patient Has Paino No Site Locations Pain Management and Medication Current Pain Management: Electronic Signature(s) Signed: 09/18/2021 3:54:54 PM By: Jerry Pilling RN, BSN Signed: 09/18/2021 4:14:43 PM By: Jerry Bradley Entered By: Jerry Bradley on 09/18/2021 13:30:55 -------------------------------------------------------------------------------- Patient/Caregiver Education Details Patient Name: Date of Service: Jerry Bradley 8/24/2023andnbsp1:30 PM Medical Record Number: [id number] Patient Account Number: [id number] Date of Birth/Gender: Treating RN: 1950-09-16 (71 y.o. Jerry Bradley Primary Care Physician: Jerry Bradley Other Clinician: Referring Physician: Treating Physician/Extender: Jerry Bradley in Treatment: 9 Education  Assessment Education Provided To: Patient Education Topics Provided Wound/Skin Impairment: Handouts: Skin Care Do's and Dont's Methods: Explain/Verbal Responses: Reinforcements needed Electronic Signature(s) Signed: 09/18/2021 3:54:54 PM By: Jerry Pilling RN, BSN Entered By: Jerry Bradley on 09/18/2021 13:31:54 -------------------------------------------------------------------------------- Wound Assessment Details Patient Name: Date of Service: Jerry Bradley 09/18/2021 1:30 PM Medical Record Number: [id number] Patient Account Number: [id number] Date of Birth/Sex: Treating RN: 1950-05-14 (71 y.o. Jerry Bradley, Meta.Reding Primary Care Race Latour: Jerry Bradley Other Clinician: Referring Windy Dudek: Treating Destany Severns/Extender: Jerry Bradley in Treatment: 9 Wound Status Wound Number: 1 Primary Venous Leg Ulcer Etiology: Wound Location: Left, Anterior Lower Leg Wound Open Wounding Event: Gradually Appeared Status: Date Acquired: 05/26/2021 Comorbid Asthma, Chronic Obstructive Pulmonary Disease (COPD), Weeks Of Treatment: 9 History: Hypertension, Hepatitis C Clustered Wound: Yes Photos Wound Measurements Length: (cm) 4.8 Width: (cm) 0.6 Depth: (cm) 0.2 Area: (cm) 2.262 Volume: (cm) 0.452 % Reduction in Area: 94.6% % Reduction in Volume: 89.2% Epithelialization: Small (1-33%) Tunneling: No Undermining: No Wound Description Classification: Full Thickness Without Exposed Support Stru Exudate Amount: Large Exudate Type: Serosanguineous Exudate Color: red, brown ctures Wound Bed Granulation Amount: Medium (34-66%) Necrotic Amount: Medium (34-66%) Necrotic Quality: Adherent Slough Treatment Notes Wound #1 (Lower Leg) Wound Laterality: Left, Anterior Cleanser Soap and Water Discharge Instruction: May shower and wash wound with dial antibacterial soap and water prior to dressing change. Peri-Wound Care Triamcinolone 15 (g) Discharge  Instruction: Use triamcinolone 15 (g) as directed Sween Lotion (Moisturizing lotion) Discharge Instruction: Apply moisturizing lotion as directed Topical Primary Dressing Promogran Prisma Matrix, 4.34 (sq in) (silver collagen) Discharge Instruction: Moisten collagen with saline. Secondary Dressing Woven Gauze Sponge, Non-Sterile 4x4 in Discharge Instruction: Apply over primary dressing as directed. Secured With Compression Wrap ThreePress (3 layer compression wrap) Discharge Instruction: Apply  three layer compression as directed. Compression Stockings Add-Ons Electronic Signature(s) Signed: 09/18/2021 3:54:54 PM By: Jerry Pilling RN, BSN Signed: 09/18/2021 4:14:43 PM By: Jerry Bradley Entered By: Jerry Bradley on 09/18/2021 13:43:47 -------------------------------------------------------------------------------- Vitals Details Patient Name: Date of Service: Jerry Bradley Bradley 09/18/2021 1:30 PM Medical Record Number: [id number] Patient Account Number: [id number] Date of Birth/Sex: Treating RN: 08/26/1950 (71 y.o. Jerry Bradley, Meta.Reding Primary Care Bevelyn Arriola: Jerry Bradley Other Clinician: Referring Tashai Catino: Treating Michaila Kenney/Extender: Jerry Bradley in Treatment: 9 Vital Signs Time Taken: 13:30 Temperature (F): 97.8 Height (in): 65 Pulse (bpm): 73 Respiratory Rate (breaths/min): 18 Blood Pressure (mmHg): 151/97 Reference Range: 80 - 120 mg / dl Electronic Signature(s) Signed: 09/18/2021 4:14:43 PM By: Jerry Bradley Entered By: Jerry Bradley on 09/18/2021 13:30:48

## 2021-09-23 ENCOUNTER — Encounter (HOSPITAL_BASED_OUTPATIENT_CLINIC_OR_DEPARTMENT_OTHER): Payer: Self-pay | Admitting: Internal Medicine

## 2021-09-23 DIAGNOSIS — L97929 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 with unspecified severity: Secondary | ICD-10-CM
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
## 2021-09-23 NOTE — Progress Notes (Signed)
LEONARDO, MAKRIS ([id number]) Visit Report for 09/23/2021 Arrival Information Details Patient Name: Date of Service: Jerry Bradley 09/23/2021 3:15 PM Medical Record Number: [id number] Patient Account Number: [id number] Date of Birth/Sex: Treating RN: 02/23/1950 (71 y.o. [name], Tammi Klippel Primary Care Deklan Minar: Asencion Noble Other Clinician: Referring Shaeleigh Graw: Treating Salmaan Patchin/Extender: Madelynn Done in Treatment: 9 Visit Information History Since Last Visit Added or deleted any medications: No Patient Arrived: [name] Any new allergies or adverse reactions: No Arrival Time: 15:55 Had a fall or experienced change in No Accompanied By: interpreter activities of daily living that may affect Transfer Assistance: None risk of falls: Patient Identification Verified: Yes Signs or symptoms of abuse/neglect since last visito No Secondary Verification Process Completed: Yes Hospitalized since last visit: No Patient Requires Transmission-Based Precautions: No Implantable device outside of the clinic excluding No Patient Has Alerts: Yes cellular tissue based products placed in the center Patient Alerts: Translator Required since last visit: Has Dressing in Place as Prescribed: Yes Has Compression in Place as Prescribed: Yes Pain Present Now: No Electronic Signature(s) Signed: 09/23/2021 5:09:16 PM By: Deon Pilling RN, BSN Entered By: Deon Pilling on 09/23/2021 15:59:05 -------------------------------------------------------------------------------- Compression Therapy Details Patient Name: Date of Service: Jerry Bradley 09/23/2021 3:15 PM Medical Record Number: [id number] Patient Account Number: [id number] Date of Birth/Sex: Treating RN: 1950-04-14 (70 y.o. Hessie Diener Primary Care Ryver Poblete: Asencion Noble Other Clinician: Referring Uzoma Vivona: Treating Ashvik Grundman/Extender: Madelynn Done in Treatment:  9 Compression Therapy Performed for Wound Assessment: Wound #1 Left,Anterior Lower Leg Performed By: Clinician Deon Pilling, RN Compression Type: Three Layer Post Procedure Diagnosis Same as Pre-procedure Electronic Signature(s) Signed: 09/23/2021 5:09:16 PM By: Deon Pilling RN, BSN Entered By: Deon Pilling on 09/23/2021 16:08:58 -------------------------------------------------------------------------------- Encounter Discharge Information Details Patient Name: Date of Service: Jerry Bradley 09/23/2021 3:15 PM Medical Record Number: [id number] Patient Account Number: [id number] Date of Birth/Sex: Treating RN: 1950-02-01 (71 y.o. Hessie Diener Primary Care Umi Mainor: Asencion Noble Other Clinician: Referring Dyneshia Baccam: Treating Hardie Veltre/Extender: Madelynn Done in Treatment: 9 Encounter Discharge Information Items Post Procedure Vitals Discharge Condition: Stable Temperature (F): 99.3 Ambulatory Status: Cane Pulse (bpm): 75 Discharge Destination: Home Respiratory Rate (breaths/min): 20 Transportation: Private Auto Blood Pressure (mmHg): 136/77 Accompanied By: interpreter Schedule Follow-up Appointment: Yes Clinical Summary of Care: Electronic Signature(s) Signed: 09/23/2021 5:09:16 PM By: Deon Pilling RN, BSN Entered By: Deon Pilling on 09/23/2021 16:13:57 -------------------------------------------------------------------------------- Lower Extremity Assessment Details Patient Name: Date of Service: Jerry Bradley 09/23/2021 3:15 PM Medical Record Number: [id number] Patient Account Number: [id number] Date of Birth/Sex: Treating RN: [birth year] (71 y.o. Hessie Diener Primary Care Darline Faith: Asencion Noble Other Clinician: Referring Sallyann Kinnaird: Treating Akili Corsetti/Extender: Madelynn Done in Treatment: 9 Edema Assessment Assessed: [name]: Yes] [name]: No] Edema: [Left: N] [Right: o] Calf Left:  Right: Point of Measurement: 34 cm From Medial Instep 31 cm Ankle Left: Right: Point of Measurement: 11 cm From Medial Instep 21 cm Vascular Assessment Pulses: Dorsalis Pedis Palpable: [Left:Yes] Electronic Signature(s) Signed: 09/23/2021 5:09:16 PM By: Deon Pilling RN, BSN Entered By: Deon Pilling on 09/23/2021 15:59:53 -------------------------------------------------------------------------------- Multi Wound Chart Details Patient Name: Date of Service: Jerry Bradley 09/23/2021 3:15 PM Medical Record Number: [id number] Patient Account Number: [id number] Date of Birth/Sex: Treating RN: August 13, 1950 (70 y.o. Hessie Diener Primary Care Cathaleen Korol: Asencion Noble Other Clinician: Referring Brondon Wann: Treating Cambrie Sonnenfeld/Extender: Madelynn Done in Treatment: 9 Vital Signs Height(in):  65 Pulse(bpm): 75 Weight(lbs): Blood Pressure(mmHg): 136/77 Body Mass Index(BMI): Temperature(F): 99.3 Respiratory Rate(breaths/min): 20 Photos: [N/A:N/A] Left, Anterior Lower Leg N/A N/A Wound Location: Gradually Appeared N/A N/A Wounding Event: Venous Leg Ulcer N/A N/A Primary Etiology: Asthma, Chronic Obstructive N/A N/A Comorbid History: Pulmonary Disease (COPD), Hypertension, Hepatitis C 05/26/2021 N/A N/A Date Acquired: 9 N/A N/A Weeks of Treatment: Open N/A N/A Wound Status: No N/A N/A Wound Recurrence: Yes N/A N/A Clustered Wound: 2 N/A N/A Clustered Quantity: 4.2x0.5x0.1 N/A N/A Measurements L x W x D (cm) 1.649 N/A N/A A (cm) : rea 0.165 N/A N/A Volume (cm) : 96.00% N/A N/A % Reduction in A rea: 96.00% N/A N/A % Reduction in Volume: Full Thickness Without Exposed N/A N/A Classification: Support Structures Medium N/A N/A Exudate A mount: Serosanguineous N/A N/A Exudate Type: red, brown N/A N/A Exudate Color: Distinct, outline attached N/A N/A Wound Margin: Medium (34-66%) N/A N/A Granulation A mount: Red N/A  N/A Granulation Quality: Medium (34-66%) N/A N/A Necrotic A mount: Fat Layer (Subcutaneous Tissue): Yes N/A N/A Exposed Structures: Fascia: No Tendon: No Muscle: No Joint: No Bone: No Large (67-100%) N/A N/A Epithelialization: Debridement - Excisional N/A N/A Debridement: Pre-procedure Verification/Time Out 16:05 N/A N/A Taken: Lidocaine 5% topical ointment N/A N/A Pain Control: Subcutaneous, Slough N/A N/A Tissue Debrided: Skin/Subcutaneous Tissue N/A N/A Level: 0.5 N/A N/A Debridement A (sq cm): rea Curette N/A N/A Instrument: Minimum N/A N/A Bleeding: Silver Nitrate N/A N/A Hemostasis A chieved: 0 N/A N/A Procedural Pain: 0 N/A N/A Post Procedural Pain: Procedure was tolerated well N/A N/A Debridement Treatment Response: 4.2x0.5x0.1 N/A N/A Post Debridement Measurements L x W x D (cm) 0.165 N/A N/A Post Debridement Volume: (cm) Compression Therapy N/A N/A Procedures Performed: Debridement Treatment Notes Wound #1 (Lower Leg) Wound Laterality: Left, Anterior Cleanser Soap and Water Discharge Instruction: May shower and wash wound with dial antibacterial soap and water prior to dressing change. Peri-Wound Care Triamcinolone 15 (g) Discharge Instruction: Use triamcinolone 15 (g) as directed Sween Lotion (Moisturizing lotion) Discharge Instruction: Apply moisturizing lotion as directed Topical Gentamicin Discharge Instruction: As directed by physician Primary Dressing PolyMem Silver Non-Adhesive Dressing, 4.25x4.25 in Discharge Instruction: Apply to wound bed as instructed Secondary Dressing Woven Gauze Sponge, Non-Sterile 4x4 in Discharge Instruction: Apply over primary dressing as directed. Secured With Compression Wrap ThreePress (3 layer compression wrap) Discharge Instruction: Apply three layer compression as directed. Compression Stockings Add-Ons Electronic Signature(s) Signed: 09/23/2021 4:26:57 PM By: Kalman Shan DO Signed:  09/23/2021 5:09:16 PM By: Deon Pilling RN, BSN Entered By: Kalman Shan on 09/23/2021 16:18:53 -------------------------------------------------------------------------------- Multi-Disciplinary Care Plan Details Patient Name: Date of Service: [name], [name] 09/23/2021 3:15 PM Medical Record Number: [id number] Patient Account Number: [id number] Date of Birth/Sex: Treating RN: January 19, 1951 (71 y.o. Hessie Diener Primary Care Hendrix Console: Asencion Noble Other Clinician: Referring Madisson Kulaga: Treating Diyari Cherne/Extender: Madelynn Done in Treatment: 9 Active Inactive Necrotic Tissue Nursing Diagnoses: Impaired tissue integrity related to necrotic/devitalized tissue Goals: Necrotic/devitalized tissue will be minimized in the wound bed Date Initiated: 07/17/2021 Target Resolution Date: 10/10/2021 Goal Status: Active Patient/caregiver will verbalize understanding of reason and process for debridement of necrotic tissue Date Initiated: 07/17/2021 Target Resolution Date: 10/10/2021 Goal Status: Active Interventions: Assess patient pain level pre-, during and post procedure and prior to discharge Treatment Activities: Apply topical anesthetic as ordered : 07/17/2021 Excisional debridement : 07/17/2021 Notes: Pain, Acute or Chronic Nursing Diagnoses: Pain, acute or chronic: actual or potential Potential alteration in comfort, pain Goals: Patient  will verbalize adequate pain control and receive pain control interventions during procedures as needed Date Initiated: 07/17/2021 Target Resolution Date: 10/10/2021 Goal Status: Active Patient/caregiver will verbalize comfort level met Date Initiated: 07/17/2021 Date Inactivated: 08/14/2021 Target Resolution Date: 08/14/2021 Goal Status: Met Interventions: Encourage patient to take pain medications as prescribed Provide education on pain management Reposition patient for comfort Treatment Activities: Administer  pain control measures as ordered : 07/17/2021 Notes: Wound/Skin Impairment Nursing Diagnoses: Knowledge deficit related to ulceration/compromised skin integrity Goals: Patient will demonstrate a reduced rate of smoking or cessation of smoking Date Initiated: 07/17/2021 Target Resolution Date: 09/26/2021 Goal Status: Active Patient/caregiver will verbalize understanding of skin care regimen Date Initiated: 07/17/2021 Target Resolution Date: 09/26/2021 Goal Status: Active Interventions: Assess patient/caregiver ability to perform ulcer/skin care regimen upon admission and as needed Assess ulceration(s) every visit Provide education on smoking Provide education on ulcer and skin care Treatment Activities: Skin care regimen initiated : 07/17/2021 Smoking cessation education : 07/17/2021 Topical wound management initiated : 07/17/2021 Notes: Electronic Signature(s) Signed: 09/23/2021 5:09:16 PM By: Deon Pilling RN, BSN Entered By: Deon Pilling on 09/23/2021 16:00:45 -------------------------------------------------------------------------------- Pain Assessment Details Patient Name: Date of Service: Jerry Bradley 09/23/2021 3:15 PM Medical Record Number: [id number] Patient Account Number: [id number] Date of Birth/Sex: Treating RN: December 16, 1950 (71 y.o. Hessie Diener Primary Care Ayren Zumbro: Asencion Noble Other Clinician: Referring Lorilyn Laitinen: Treating Yamir Carignan/Extender: Madelynn Done in Treatment: 9 Active Problems Location of Pain Severity and Description of Pain Patient Has Paino No Site Locations Rate the pain. Current Pain Level: 0 Pain Management and Medication Current Pain Management: Medication: No Cold Application: No Rest: No Massage: No Activity: No T.E.N.S.: No Heat Application: No Leg drop or elevation: No Is the Current Pain Management Adequate: Adequate How does your wound impact your activities of daily livingo Sleep:  No Bathing: No Appetite: No Relationship With Others: No Bladder Continence: No Emotions: No Bowel Continence: No Work: No Toileting: No Drive: No Dressing: No Hobbies: No Engineer, maintenance) Signed: 09/23/2021 5:09:16 PM By: Deon Pilling RN, BSN Entered By: Deon Pilling on 09/23/2021 15:59:28 -------------------------------------------------------------------------------- Patient/Caregiver Education Details Patient Name: Date of Service: Jerry Bradley 8/29/2023andnbsp3:15 PM Medical Record Number: [id number] Patient Account Number: [id number] Date of Birth/Gender: Treating RN: [birth year] (71 y.o. Hessie Diener Primary Care Physician: Asencion Noble Other Clinician: Referring Physician: Treating Physician/Extender: Madelynn Done in Treatment: 9 Education Assessment Education Provided To: Patient Education Topics Provided Wound/Skin Impairment: Handouts: Caring for Your Ulcer Spanish Methods: Explain/Verbal Responses: Reinforcements needed Electronic Signature(s) Signed: 09/23/2021 5:09:16 PM By: Deon Pilling RN, BSN Entered By: Deon Pilling on 09/23/2021 16:08:44 -------------------------------------------------------------------------------- Wound Assessment Details Patient Name: Date of Service: Jerry Bradley 09/23/2021 3:15 PM Medical Record Number: [id number] Patient Account Number: [id number] Date of Birth/Sex: Treating RN: 1950-05-31 (71 y.o. [name], Meta.Reding Primary Care Matt Delpizzo: Asencion Noble Other Clinician: Referring Sammy Cassar: Treating Shykeria Sakamoto/Extender: Madelynn Done in Treatment: 9 Wound Status Wound Number: 1 Primary Venous Leg Ulcer Etiology: Wound Location: Left, Anterior Lower Leg Wound Open Wounding Event: Gradually Appeared Status: Date Acquired: 05/26/2021 Comorbid Asthma, Chronic Obstructive Pulmonary Disease (COPD), Weeks Of Treatment: 9 History:  Hypertension, Hepatitis C Clustered Wound: Yes Photos Wound Measurements Length: (cm) 4 Width: (cm) 0 Depth: (cm) 0 Clustered Quantity: 2 Area: (cm) Volume: (cm) .2 % Reduction in Area: 96% .5 % Reduction in Volume: 96% .1 Epithelialization: Large (67-100%) Tunneling: No 1.649 Undermining: No 0.165 Wound Description Classification:  Full Thickness Without Exposed Support Structures Wound Margin: Distinct, outline attached Exudate Amount: Medium Exudate Type: Serosanguineous Exudate Color: red, brown Foul Odor After Cleansing: No Slough/Fibrino Yes Wound Bed Granulation Amount: Medium (34-66%) Exposed Structure Granulation Quality: Red Fascia Exposed: No Necrotic Amount: Medium (34-66%) Fat Layer (Subcutaneous Tissue) Exposed: Yes Necrotic Quality: Adherent Slough Tendon Exposed: No Muscle Exposed: No Joint Exposed: No Bone Exposed: No Treatment Notes Wound #1 (Lower Leg) Wound Laterality: Left, Anterior Cleanser Soap and Water Discharge Instruction: May shower and wash wound with dial antibacterial soap and water prior to dressing change. Peri-Wound Care Triamcinolone 15 (g) Discharge Instruction: Use triamcinolone 15 (g) as directed Sween Lotion (Moisturizing lotion) Discharge Instruction: Apply moisturizing lotion as directed Topical Gentamicin Discharge Instruction: As directed by physician Primary Dressing PolyMem Silver Non-Adhesive Dressing, 4.25x4.25 in Discharge Instruction: Apply to wound bed as instructed Secondary Dressing Woven Gauze Sponge, Non-Sterile 4x4 in Discharge Instruction: Apply over primary dressing as directed. Secured With Compression Wrap ThreePress (3 layer compression wrap) Discharge Instruction: Apply three layer compression as directed. Compression Stockings Add-Ons Electronic Signature(s) Signed: 09/23/2021 5:09:16 PM By: Deon Pilling RN, BSN Entered By: Deon Pilling on 09/23/2021  16:00:32 -------------------------------------------------------------------------------- Vitals Details Patient Name: Date of Service: [name], [name] 09/23/2021 3:15 PM Medical Record Number: [id number] Patient Account Number: [id number] Date of Birth/Sex: Treating RN: [birth year] (70 y.o. [name], Meta.Reding Primary Care Xadrian Craighead: Asencion Noble Other Clinician: Referring Thinh Cuccaro: Treating Eudell Mcphee/Extender: Madelynn Done in Treatment: 9 Vital Signs Time Taken: 15:55 Temperature (F): 99.3 Height (in): 65 Pulse (bpm): 75 Respiratory Rate (breaths/min): 20 Blood Pressure (mmHg): 136/77 Reference Range: 80 - 120 mg / dl Electronic Signature(s) Signed: 09/23/2021 5:09:16 PM By: Deon Pilling RN, BSN Entered By: Deon Pilling on 09/23/2021 15:59:20

## 2021-09-24 ENCOUNTER — Other Ambulatory Visit: Payer: Self-pay

## 2021-10-01 ENCOUNTER — Other Ambulatory Visit: Payer: Self-pay | Admitting: Pharmacist

## 2021-10-01 ENCOUNTER — Other Ambulatory Visit: Payer: Self-pay

## 2021-10-01 MED ORDER — ALBUTEROL SULFATE HFA 108 (90 BASE) MCG/ACT IN AERS
2.0000 | INHALATION_SPRAY | RESPIRATORY_TRACT | 1 refills | Status: DC | PRN
  Filled 2021-10-01: qty 6.7, 17d supply, fill #0

## 2021-10-02 ENCOUNTER — Encounter (HOSPITAL_BASED_OUTPATIENT_CLINIC_OR_DEPARTMENT_OTHER): Payer: Self-pay | Attending: Internal Medicine | Admitting: Internal Medicine

## 2021-10-02 ENCOUNTER — Emergency Department (HOSPITAL_COMMUNITY)
Admission: EM | Admit: 2021-10-02 | Discharge: 2021-10-03 | Disposition: A | Discharge status: Home / Self Care | Payer: Self-pay | Attending: Emergency Medicine | Admitting: Emergency Medicine

## 2021-10-02 ENCOUNTER — Encounter (HOSPITAL_COMMUNITY): Payer: Self-pay

## 2021-10-02 ENCOUNTER — Other Ambulatory Visit: Payer: Self-pay

## 2021-10-02 DIAGNOSIS — L97929 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 with unspecified severity: Secondary | ICD-10-CM | POA: Insufficient documentation

## 2021-10-02 DIAGNOSIS — I87312 Chronic venous hypertension (idiopathic) with ulcer of left lower extremity: Secondary | ICD-10-CM | POA: Insufficient documentation

## 2021-10-02 DIAGNOSIS — Z8709 Personal history of other diseases of the respiratory system: Secondary | ICD-10-CM

## 2021-10-02 DIAGNOSIS — Z89611 Acquired absence of right leg above knee: Secondary | ICD-10-CM | POA: Insufficient documentation

## 2021-10-02 DIAGNOSIS — Z20822 Contact with and (suspected) exposure to covid-19: Secondary | ICD-10-CM | POA: Insufficient documentation

## 2021-10-02 DIAGNOSIS — J45909 Unspecified asthma, uncomplicated: Secondary | ICD-10-CM | POA: Insufficient documentation

## 2021-10-02 DIAGNOSIS — J449 Chronic obstructive pulmonary disease, unspecified: Secondary | ICD-10-CM | POA: Insufficient documentation

## 2021-10-02 DIAGNOSIS — J189 Pneumonia, unspecified organism: Secondary | ICD-10-CM | POA: Insufficient documentation

## 2021-10-02 DIAGNOSIS — I890 Lymphedema, not elsewhere classified: Secondary | ICD-10-CM | POA: Insufficient documentation

## 2021-10-02 DIAGNOSIS — B182 Chronic viral hepatitis C: Secondary | ICD-10-CM | POA: Insufficient documentation

## 2021-10-02 DIAGNOSIS — Z7951 Long term (current) use of inhaled steroids: Secondary | ICD-10-CM | POA: Insufficient documentation

## 2021-10-02 DIAGNOSIS — I10 Essential (primary) hypertension: Secondary | ICD-10-CM | POA: Insufficient documentation

## 2021-10-02 NOTE — ED Triage Notes (Signed)
BIBA for shob, productive cough, and right sided CP x1 day. Inhaler with no relief.  Ems reports rhonchi and wheezing. Ems admin 125mg  soul-medrol and 1 duoneb.
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
## 2021-10-03 ENCOUNTER — Emergency Department (HOSPITAL_COMMUNITY): Payer: Self-pay

## 2021-10-03 ENCOUNTER — Other Ambulatory Visit: Payer: Self-pay

## 2021-10-03 LAB — CBC WITH DIFFERENTIAL/PLATELET
Abs Immature Granulocytes: 0.07 10*3/uL (ref 0.00–0.07)
Basophils Absolute: 0.1 10*3/uL (ref 0.0–0.1)
Basophils Relative: 1 %
Eosinophils Absolute: 0.1 10*3/uL (ref 0.0–0.5)
Eosinophils Relative: 1 %
HCT: 39.4 % (ref 39.0–52.0)
Hemoglobin: 12.8 g/dL — ABNORMAL LOW (ref 13.0–17.0)
Immature Granulocytes: 1 %
Lymphocytes Relative: 4 %
Lymphs Abs: 0.5 10*3/uL — ABNORMAL LOW (ref 0.7–4.0)
MCH: 27.9 pg (ref 26.0–34.0)
MCHC: 32.5 g/dL (ref 30.0–36.0)
MCV: 86 fL (ref 80.0–100.0)
Monocytes Absolute: 0.2 10*3/uL (ref 0.1–1.0)
Monocytes Relative: 1 %
Neutro Abs: 11.9 10*3/uL — ABNORMAL HIGH (ref 1.7–7.7)
Neutrophils Relative %: 92 %
Platelets: 369 10*3/uL (ref 150–400)
RBC: 4.58 MIL/uL (ref 4.22–5.81)
RDW: 13 % (ref 11.5–15.5)
WBC: 12.8 10*3/uL — ABNORMAL HIGH (ref 4.0–10.5)
nRBC: 0 % (ref 0.0–0.2)

## 2021-10-03 LAB — TROPONIN I (HIGH SENSITIVITY): Troponin I (High Sensitivity): 4 ng/L (ref <18)

## 2021-10-03 LAB — BASIC METABOLIC PANEL
Anion gap: 5 (ref 5–15)
BUN: 15 mg/dL (ref 8–23)
CO2: 25 mmol/L (ref 22–32)
Calcium: 8.9 mg/dL (ref 8.9–10.3)
Chloride: 110 mmol/L (ref 98–111)
Creatinine, Ser: 1.13 mg/dL (ref 0.61–1.24)
GFR, Estimated: >60 mL/min (ref >60)
Glucose, Bld: 113 mg/dL — ABNORMAL HIGH (ref 70–99)
Potassium: 3.9 mmol/L (ref 3.5–5.1)
Sodium: 140 mmol/L (ref 135–145)

## 2021-10-03 LAB — BRAIN NATRIURETIC PEPTIDE: B Natriuretic Peptide: 22.1 pg/mL (ref 0.0–100.0)

## 2021-10-03 LAB — SARS CORONAVIRUS 2 BY RT PCR: SARS Coronavirus 2 by RT PCR: NEGATIVE

## 2021-10-03 MED ORDER — PREDNISONE 10 MG PO TABS
20.0000 mg | ORAL_TABLET | Freq: Two times a day (BID) | ORAL | 0 refills | Status: DC
Start: 2021-10-03 — End: 2021-12-12
  Filled 2021-10-03: qty 20, 5d supply, fill #0

## 2021-10-03 MED ORDER — IPRATROPIUM-ALBUTEROL 0.5-2.5 (3) MG/3ML IN SOLN
3.0000 mL | Freq: Once | RESPIRATORY_TRACT | Status: AC
  Administered 2021-10-03: 3 mL via RESPIRATORY_TRACT
  Filled 2021-10-03: qty 3

## 2021-10-03 MED ORDER — AZITHROMYCIN 250 MG PO TABS
250.0000 mg | ORAL_TABLET | Freq: Every day | ORAL | 0 refills | Status: DC
Start: 2021-10-03 — End: 2021-10-06
  Filled 2021-10-03 (×2): qty 4, 4d supply, fill #0

## 2021-10-03 MED ORDER — AZITHROMYCIN 250 MG PO TABS
500.0000 mg | ORAL_TABLET | Freq: Once | ORAL | Status: AC
  Administered 2021-10-03: 500 mg via ORAL
  Filled 2021-10-03: qty 2

## 2021-10-03 MED ORDER — ALBUTEROL SULFATE HFA 108 (90 BASE) MCG/ACT IN AERS
2.0000 | INHALATION_SPRAY | Freq: Four times a day (QID) | RESPIRATORY_TRACT | 0 refills | Status: DC | PRN
  Filled 2021-10-03: qty 1, fill #0
  Filled 2021-10-03: qty 6.7, 25d supply, fill #0

## 2021-10-03 NOTE — Discharge Instructions (Addendum)
Begin taking Zithromax and prednisone as prescribed.  Continue use of your albuterol inhaler, 2 puffs every 4 hours as needed.  Follow-up with primary doctor and return to the ER if symptoms significantly worsen or change.

## 2021-10-03 NOTE — Progress Notes (Signed)
WM, SAHAGUN ([id number]) Visit Report for 10/02/2021 Arrival Information Details Patient Name: Date of Service: Jerry Bradley UE 10/02/2021 3:00 PM Medical Record Number: [id number] Patient Account Number: [id number] Date of Birth/Sex: Treating RN: Jerry Bradley (71 y.o. M) Primary Care Jerry Bradley: Jerry Bradley Other Clinician: Referring Jerry Bradley: Treating Jerry Bradley/Extender: Jerry Bradley in Treatment: 11 Visit Information History Since Last Visit Added or deleted any medications: No Patient Arrived: Jerry Bradley Any new allergies or adverse reactions: No Arrival Time: 15:42 Had a fall or experienced change in No Accompanied By: son activities of daily living that may affect Transfer Assistance: None risk of falls: Patient Identification Verified: Yes Signs or symptoms of abuse/neglect since last visito No Secondary Verification Process Completed: Yes Hospitalized since last visit: No Patient Requires Transmission-Based Precautions: No Implantable device outside of the clinic excluding No Patient Has Alerts: Yes cellular tissue based products placed in the center Patient Alerts: Translator Required since last visit: Has Compression in Place as Prescribed: Yes Pain Present Now: No Electronic Signature(s) Signed: 10/03/2021 12:32:37 PM By: Jerry Bradley Entered By: Jerry Bradley on 10/02/2021 15:43:06 -------------------------------------------------------------------------------- Compression Therapy Details Patient Name: Date of Service: Jerry Bradley UE 10/02/2021 3:00 PM Medical Record Number: [id number] Patient Account Number: [id number] Date of Birth/Sex: Treating RN: [birth year] (71 y.o. Jerry Bradley Primary Care Jerry Bradley: Jerry Bradley Other Clinician: Referring Jerry Bradley: Treating Jerry Bradley/Extender: Jerry Bradley in Treatment: 11 Compression Therapy Performed for Wound Assessment: Wound #1 Left,Anterior Lower  Leg Performed By: Clinician Jerry Pilling, RN Compression Type: Three Layer Post Procedure Diagnosis Same as Pre-procedure Electronic Signature(s) Signed: 10/02/2021 4:40:53 PM By: Jerry Pilling RN, BSN Entered By: Jerry Bradley on 10/02/2021 16:02:30 -------------------------------------------------------------------------------- Encounter Discharge Information Details Patient Name: Date of Service: Jerry Bradley, Jerry Bradley UE 10/02/2021 3:00 PM Medical Record Number: [id number] Patient Account Number: [id number] Date of Birth/Sex: Treating RN: [birth year] (72 y.o. Jerry Bradley Primary Care Jerry Bradley: Jerry Bradley Other Clinician: Referring Jerry Bradley: Treating Jerry Bradley: Jerry Bradley in Treatment: 11 Encounter Discharge Information Items Discharge Condition: Stable Ambulatory Status: Cane Discharge Destination: Home Transportation: Private Auto Accompanied By: son Schedule Follow-up Appointment: Yes Clinical Summary of Care: Electronic Signature(s) Signed: 10/02/2021 4:40:53 PM By: Jerry Pilling RN, BSN Entered By: Jerry Bradley on 10/02/2021 16:04:06 -------------------------------------------------------------------------------- Lower Extremity Assessment Details Patient Name: Date of Service: Jerry Bradley UE 10/02/2021 3:00 PM Medical Record Number: [id number] Patient Account Number: [id number] Date of Birth/Sex: Treating RN: [birth year] (71 y.o. M) Primary Care Annalynne Bradley: Jerry Bradley Other Clinician: Referring Jerry Bradley: Treating Jerry Bradley/Extender: Jerry Bradley in Treatment: 11 Edema Assessment Assessed: Jerry Bradley: No] Jerry Bradley: No] Edema: [Left: N] [Right: o] Calf Left: Right: Point of Measurement: 34 cm From Medial Instep 33 cm Ankle Left: Right: Point of Measurement: 11 cm From Medial Instep 21 cm Electronic Signature(s) Signed: 10/03/2021 12:32:37 PM By: Jerry Bradley Entered By: Jerry Bradley on  10/02/2021 15:52:36 -------------------------------------------------------------------------------- Multi Wound Chart Details Patient Name: Date of Service: Jerry Bradley, Jerry Bradley UE 10/02/2021 3:00 PM Medical Record Number: [id number] Patient Account Number: [id number] Date of Birth/Sex: Treating RN: [birth year] (72 y.o. M) Primary Care Jerry Bradley: Jerry Bradley Other Clinician: Referring Siya Bradley: Treating Jerry Bradley/Extender: Jerry Bradley in Treatment: 11 Vital Signs Height(in): 51 Pulse(bpm): 14 Weight(lbs): Blood Pressure(mmHg): 123/75 Body Mass Index(BMI): Temperature(F): 98.2 Respiratory Rate(breaths/min): 20 Photos: [N/A:N/A] Left, Anterior Lower Leg N/A N/A Wound Location: Gradually Appeared N/A N/A Wounding Event: Venous Leg Ulcer N/A N/A Primary Etiology: Asthma, Chronic Obstructive  N/A N/A Comorbid History: Pulmonary Disease (COPD), Hypertension, Hepatitis C 05/26/2021 N/A N/A Date Acquired: 11 N/A N/A Weeks of Treatment: Open N/A N/A Wound Status: No N/A N/A Wound Recurrence: Yes N/A N/A Clustered Wound: 2 N/A N/A Clustered Quantity: 0.7x0.3x0.1 N/A N/A Measurements L x W x D (cm) 0.165 N/A N/A A (cm) : rea 0.016 N/A N/A Volume (cm) : 99.60% N/A N/A % Reduction in Area: 99.60% N/A N/A % Reduction in Volume: Full Thickness Without Exposed N/A N/A Classification: Support Structures Medium N/A N/A Exudate Amount: Serosanguineous N/A N/A Exudate Type: red, brown N/A N/A Exudate Color: Distinct, outline attached N/A N/A Wound Margin: Large (67-100%) N/A N/A Granulation Amount: Red N/A N/A Granulation Quality: Small (1-33%) N/A N/A Necrotic Amount: Fat Layer (Subcutaneous Tissue): Yes N/A N/A Exposed Structures: Fascia: No Tendon: No Muscle: No Joint: No Bone: No Large (67-100%) N/A N/A Epithelialization: Compression Therapy N/A N/A Procedures Performed: Treatment Notes Wound #1 (Lower Leg) Wound  Laterality: Left, Anterior Cleanser Soap and Water Discharge Instruction: May shower and wash wound with dial antibacterial soap and water prior to dressing change. Peri-Wound Care Triamcinolone 15 (g) Discharge Instruction: Use triamcinolone 15 (g) as directed Sween Lotion (Moisturizing lotion) Discharge Instruction: Apply moisturizing lotion as directed Topical Primary Dressing PolyMem Silver Non-Adhesive Dressing, 4.25x4.25 in Discharge Instruction: Apply to wound bed as instructed Xeroform Occlusive Gauze Dressing, 4x4 in Discharge Instruction: Apply to the closed areas. Secondary Dressing Woven Gauze Sponge, Non-Sterile 4x4 in Discharge Instruction: Apply over primary dressing as directed. Secured With Compression Wrap ThreePress (3 layer compression wrap) Discharge Instruction: Apply three layer compression as directed. Compression Stockings Add-Ons Electronic Signature(s) Signed: 10/02/2021 4:12:25 PM By: Kalman Shan DO Entered By: Kalman Shan on 10/02/2021 16:04:51 -------------------------------------------------------------------------------- Multi-Disciplinary Care Plan Details Patient Name: Date of Service: Jerry Bradley, Jerry Bradley UE 10/02/2021 3:00 PM Medical Record Number: [id number] Patient Account Number: [id number] Date of Birth/Sex: Treating RN: Jerry [birth year], Bradley (71 y.o. [name], Meta.Reding Primary Care Tattiana Fakhouri: Jerry Bradley Other Clinician: Referring Cleora Karnik: Treating Chayanne Speir/Extender: Jerry Bradley in Treatment: 11 Active Inactive Necrotic Tissue Nursing Diagnoses: Impaired tissue integrity related to necrotic/devitalized tissue Goals: Necrotic/devitalized tissue will be minimized in the wound bed Date Initiated: 07/17/2021 Target Resolution Date: 10/24/2021 Goal Status: Active Patient/caregiver will verbalize understanding of reason and process for debridement of necrotic tissue Date Initiated: 07/17/2021 Target Resolution  Date: 10/24/2021 Goal Status: Active Interventions: Assess patient pain level pre-, during and post procedure and prior to discharge Treatment Activities: Apply topical anesthetic as ordered : 07/17/2021 Excisional debridement : 07/17/2021 Notes: Pain, Acute or Chronic Nursing Diagnoses: Pain, acute or chronic: actual or potential Potential alteration in comfort, pain Goals: Patient will verbalize adequate pain control and receive pain control interventions during procedures as needed Date Initiated: 07/17/2021 Target Resolution Date: 10/24/2021 Goal Status: Active Patient/caregiver will verbalize comfort level met Date Initiated: 07/17/2021 Date Inactivated: 08/14/2021 Target Resolution Date: 08/14/2021 Goal Status: Met Interventions: Encourage patient to take pain medications as prescribed Provide education on pain management Reposition patient for comfort Treatment Activities: Administer pain control measures as ordered : 07/17/2021 Notes: Wound/Skin Impairment Nursing Diagnoses: Knowledge deficit related to ulceration/compromised skin integrity Goals: Patient will demonstrate a reduced rate of smoking or cessation of smoking Date Initiated: 07/17/2021 Target Resolution Date: 10/24/2021 Goal Status: Active Patient/caregiver will verbalize understanding of skin care regimen Date Initiated: 07/17/2021 Date Inactivated: 10/02/2021 Target Resolution Date: 10/02/2021 Goal Status: Met Interventions: Assess patient/caregiver ability to perform ulcer/skin care regimen upon admission and as needed Assess ulceration(s)  every visit Provide education on smoking Provide education on ulcer and skin care Treatment Activities: Skin care regimen initiated : 07/17/2021 Smoking cessation education : 07/17/2021 Topical wound management initiated : 07/17/2021 Notes: Electronic Signature(s) Signed: 10/02/2021 4:40:53 PM By: Jerry Pilling RN, BSN Entered By: Jerry Bradley on 10/02/2021  15:55:38 -------------------------------------------------------------------------------- Pain Assessment Details Patient Name: Date of Service: Jerry Bradley, Jerry Bradley UE 10/02/2021 3:00 PM Medical Record Number: [id number] Patient Account Number: [id number] Date of Birth/Sex: Treating RN: [birth year] (71 y.o. M) Primary Care Kayron Hicklin: Jerry Bradley Other Clinician: Referring Natale Thoma: Treating Waylyn Tenbrink/Extender: Jerry Bradley in Treatment: 11 Active Problems Location of Pain Severity and Description of Pain Patient Has Paino No Site Locations Pain Management and Medication Current Pain Management: Electronic Signature(s) Signed: 10/03/2021 12:32:37 PM By: Jerry Bradley Entered By: Jerry Bradley on 10/02/2021 15:45:30 -------------------------------------------------------------------------------- Patient/Caregiver Education Details Patient Name: Date of Service: Jerry Bradley UE 9/7/2023andnbsp3:00 PM Medical Record Number: [id number] Patient Account Number: [id number] Date of Birth/Gender: Treating RN: [birth year] (71 y.o. Jerry Bradley Primary Care Physician: Jerry Bradley Other Clinician: Referring Physician: Treating Physician/Extender: Jerry Bradley in Treatment: 11 Education Assessment Education Provided To: Patient Education Topics Provided Wound/Skin Impairment: Handouts: Skin Care Do's and Dont's Methods: Explain/Verbal Responses: Reinforcements needed Electronic Signature(s) Signed: 10/02/2021 4:40:53 PM By: Jerry Pilling RN, BSN Entered By: Jerry Bradley on 10/02/2021 15:55:48 -------------------------------------------------------------------------------- Wound Assessment Details Patient Name: Date of Service: Jerry Bradley UE 10/02/2021 3:00 PM Medical Record Number: [id number] Patient Account Number: [id number] Date of Birth/Sex: Treating RN: [birth year] Bradley (71 y.o. M) Primary Care Noura Purpura:  Jerry Bradley Other Clinician: Referring Raynee Mccasland: Treating Gissel Keilman/Extender: Jerry Bradley in Treatment: 11 Wound Status Wound Number: 1 Primary Venous Leg Ulcer Etiology: Wound Location: Left, Anterior Lower Leg Wound Open Wounding Event: Gradually Appeared Status: Date Acquired: 05/26/2021 Comorbid Asthma, Chronic Obstructive Pulmonary Disease (COPD), Weeks Of Treatment: 11 History: Hypertension, Hepatitis C Clustered Wound: Yes Photos Wound Measurements Length: (cm) 0.7 Width: (cm) 0.3 Depth: (cm) 0.1 Clustered Quantity: 2 Area: (cm) 0.165 Volume: (cm) 0.016 % Reduction in Area: 99.6% % Reduction in Volume: 99.6% Epithelialization: Large (67-100%) Tunneling: No Undermining: No Wound Description Classification: Full Thickness Without Exposed Support Structures Wound Margin: Distinct, outline attached Exudate Amount: Medium Exudate Type: Serosanguineous Exudate Color: red, brown Foul Odor After Cleansing: No Slough/Fibrino Yes Wound Bed Granulation Amount: Large (67-100%) Exposed Structure Granulation Quality: Red Fascia Exposed: No Necrotic Amount: Small (1-33%) Fat Layer (Subcutaneous Tissue) Exposed: Yes Necrotic Quality: Adherent Slough Tendon Exposed: No Muscle Exposed: No Joint Exposed: No Bone Exposed: No Treatment Notes Wound #1 (Lower Leg) Wound Laterality: Left, Anterior Cleanser Soap and Water Discharge Instruction: May shower and wash wound with dial antibacterial soap and water prior to dressing change. Peri-Wound Care Triamcinolone 15 (g) Discharge Instruction: Use triamcinolone 15 (g) as directed Sween Lotion (Moisturizing lotion) Discharge Instruction: Apply moisturizing lotion as directed Topical Primary Dressing PolyMem Silver Non-Adhesive Dressing, 4.25x4.25 in Discharge Instruction: Apply to wound bed as instructed Xeroform Occlusive Gauze Dressing, 4x4 in Discharge Instruction: Apply to the closed  areas. Secondary Dressing Woven Gauze Sponge, Non-Sterile 4x4 in Discharge Instruction: Apply over primary dressing as directed. Secured With Compression Wrap ThreePress (3 layer compression wrap) Discharge Instruction: Apply three layer compression as directed. Compression Stockings Add-Ons Electronic Signature(s) Signed: 10/03/2021 12:32:37 PM By: Jerry Bradley Entered By: Jerry Bradley on 10/02/2021 15:52:20 -------------------------------------------------------------------------------- Vitals Details Patient Name: Date of Service: Jerry Bradley, Jerry Bradley UE 10/02/2021 3:00 PM Medical  Record Number: [id number] Patient Account Number: [id number] Date of Birth/Sex: Treating RN: [birth year] (71 y.o. M) Primary Care Aidee Latimore: Jerry Bradley Other Clinician: Referring Annie Saephan: Treating [place] Dube/Extender: Jerry Bradley in Treatment: 11 Vital Signs Time Taken: 15:43 Temperature (F): 98.2 Height (in): 65 Pulse (bpm): 78 Respiratory Rate (breaths/min): 20 Blood Pressure (mmHg): 123/75 Reference Range: 80 - 120 mg / dl Electronic Signature(s) Signed: 10/03/2021 12:32:37 PM By: Jerry Bradley Entered By: Jerry Bradley on 10/02/2021 15:45:19

## 2021-10-03 NOTE — ED Provider Notes (Signed)
[facility] Provider Note   CSN: [id number] Arrival date & time: 10/02/21  2342     History  Chief Complaint  Patient presents with   Shortness of Breath    Jerry Bradley is a 71 y.o. male.  Patient is a 71 year old male with past medical history of asthma, hepatitis C, status post right above-the-knee amputation.  Patient presenting today for evaluation of shortness of breath.  He describes wheezing and feeling short of breath, but no productive cough, chest pain, or fever.  He has been using his albuterol inhaler with little relief.  History taken with the use of the translator tablet as patient speaks minimal [place], mainly [place].  The history is provided by the patient.  Shortness of Breath Severity:  Moderate Onset quality:  Gradual Duration:  3 days Timing:  Constant Progression:  Worsening Chronicity:  New Relieved by:  Nothing Worsened by:  Nothing Ineffective treatments:  Inhaler      Home Medications Prior to Admission medications   Medication Sig Start Date End Date Taking? Authorizing Provider  albuterol (PROVENTIL HFA) 108 (90 Base) MCG/ACT inhaler Inhale 2 puffs into the lungs every 4 (four) hours as needed for wheezing or shortness of breath. 10/01/21   Storm Frisk, MD  amLODipine (NORVASC) 10 MG tablet Take 1 tablet (10 mg total) by mouth daily 07/02/21 10/08/21  Storm Frisk, MD  Bismuth Tribromoph-Petrolatum (XEROFORM OCCLUSIVE GAUZE PATCH) PADS Use as directed. Patient not taking: Reported on 08/05/2021 07/02/21   Storm Frisk, MD  fluticasone furoate-vilanterol (BREO ELLIPTA) 100-25 MCG/ACT AEPB Inhale 1 puff into the lungs daily. 08/05/21   Storm Frisk, MD  HYDROcodone-acetaminophen (NORCO/VICODIN) 5-325 MG tablet Take 1-2 tablets by mouth every 4 hours as needed for moderate pain. Patient not taking: Reported on 08/05/2021 07/02/21   Storm Frisk, MD      Allergies    Patient has no known  allergies.    Review of Systems   Review of Systems  Respiratory:  Positive for shortness of breath.   All other systems reviewed and are negative.   Physical Exam Updated Vital Signs BP (!) 149/68   Pulse 81   Temp 97.8 F (36.6 C) (Oral)   Resp 20   Ht 5\' 8"  (1.727 m)   Wt 64 kg   SpO2 91%   BMI 21.45 kg/m  Physical Exam Vitals and nursing note reviewed.  Constitutional:      General: He is not in acute distress.    Appearance: He is well-developed. He is not diaphoretic.  HENT:     Head: Normocephalic and atraumatic.  Cardiovascular:     Rate and Rhythm: Normal rate and regular rhythm.     Heart sounds: No murmur heard.    No friction rub.  Pulmonary:     Effort: Pulmonary effort is normal. No respiratory distress.     Breath sounds: Examination of the right-middle field reveals rhonchi. Examination of the left-middle field reveals rhonchi. Rhonchi present. No wheezing or rales.     Comments: There are slight expiratory rhonchi bilaterally, but no respiratory distress Abdominal:     General: Bowel sounds are normal. There is no distension.     Palpations: Abdomen is soft.     Tenderness: There is no abdominal tenderness.  Musculoskeletal:        General: Normal range of motion.     Cervical back: Normal range of motion and neck supple.  Skin:  General: Skin is warm and dry.  Neurological:     Mental Status: He is alert and oriented to person, place, and time.     Coordination: Coordination normal.     ED Results / Procedures / Treatments   Labs (all labs ordered are listed, but only abnormal results are displayed) Labs Reviewed  CBC WITH DIFFERENTIAL/PLATELET - Abnormal; Notable for the following components:      Result Value   WBC 12.8 (*)    Hemoglobin 12.8 (*)    Neutro Abs 11.9 (*)    Lymphs Abs 0.5 (*)    All other components within normal limits  SARS CORONAVIRUS 2 BY RT PCR  BRAIN NATRIURETIC PEPTIDE  BASIC METABOLIC PANEL  TROPONIN I (HIGH  SENSITIVITY)    EKG EKG Interpretation  Date/Time:  Thursday October 02 2021 23:53:07 EDT Ventricular Rate:  84 PR Interval:  166 QRS Duration: 88 QT Interval:  367 QTC Calculation: 434 R Axis:   11 Text Interpretation: Sinus rhythm Abnormal R-wave progression, early transition Confirmed by Geoffery Lyons ([id number]) on 10/03/2021 2:48:22 AM  Radiology No results found.  Procedures Procedures    Medications Ordered in ED Medications  ipratropium-albuterol (DUONEB) 0.5-2.5 (3) MG/3ML nebulizer solution 3 mL (3 mLs Nebulization Given 10/03/21 0240)  ipratropium-albuterol (DUONEB) 0.5-2.5 (3) MG/3ML nebulizer solution 3 mL (3 mLs Nebulization Given 10/03/21 0240)    ED Course/ Medical Decision Making/ A&P  This patient presents to the ED for concern of shortness of breath and cough, this involves an extensive number of treatment options, and is a complaint that carries with it a high risk of complications and morbidity.  The differential diagnosis includes pneumonia, asthma exacerbation, CHF   Co morbidities that complicate the patient evaluation  Asthma   Additional history obtained:  No additional history or external records needed   Lab Tests:  I Ordered, and personally interpreted labs.  The pertinent results include: Unremarkable CBC, metabolic panel, troponin, and BNP   Imaging Studies ordered:  I ordered imaging studies including chest x-ray I independently visualized and interpreted imaging which showed bibasilar infiltrates I agree with the radiologist interpretation   Cardiac Monitoring: / EKG:  The patient was maintained on a cardiac monitor.  I personally viewed and interpreted the cardiac monitored which showed an underlying rhythm of: Sinus   Consultations Obtained:  No urgent consultations needed   Problem List / ED Course / Critical interventions / Medication management  Patient presenting with cough and shortness of breath, worsening over the  past several days.  He has history of asthma and is having no relief with his inhaler.  His work-up shows bibasilar infiltrates consistent with pneumonia.  Oxygen saturations are in the low 90s on room air, however I suspect that this is his baseline.  He is well-appearing and seems appropriate for discharge.  The remainder of his work-up is unremarkable otherwise.  Patient did receive Solu-Medrol by EMS hence no further was administered here I ordered medication including Zithromax, DuoNeb for for asthma exacerbation/pneumonia Reevaluation of the patient after these medicines showed that the patient improved I have reviewed the patients home medicines and have made adjustments as needed   Social Determinants of Health:  None   Test / Admission - Considered:  Patient to be discharged with antibiotics, steroids, continued use of his inhaler.  To return as needed.  Final Clinical Impression(s) / ED Diagnoses Final diagnoses:  None    Rx / DC Orders ED Discharge Orders  None         Veryl Speak, MD 10/03/21 [phone]
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
## 2021-10-06 ENCOUNTER — Other Ambulatory Visit: Payer: Self-pay

## 2021-10-06 ENCOUNTER — Encounter: Payer: Self-pay | Admitting: Critical Care Medicine

## 2021-10-06 ENCOUNTER — Ambulatory Visit: Payer: Self-pay | Attending: Critical Care Medicine | Admitting: Critical Care Medicine

## 2021-10-06 VITALS — BP 152/78 | HR 65 | Ht 68.0 in | Wt 141.6 lb

## 2021-10-06 DIAGNOSIS — J4540 Moderate persistent asthma, uncomplicated: Secondary | ICD-10-CM

## 2021-10-06 DIAGNOSIS — B182 Chronic viral hepatitis C: Secondary | ICD-10-CM

## 2021-10-06 DIAGNOSIS — I10 Essential (primary) hypertension: Secondary | ICD-10-CM

## 2021-10-06 DIAGNOSIS — L03116 Cellulitis of left lower limb: Secondary | ICD-10-CM

## 2021-10-06 MED ORDER — VALSARTAN 160 MG PO TABS
160.0000 mg | ORAL_TABLET | Freq: Every day | ORAL | 3 refills | Status: DC
  Filled 2021-10-06: qty 30, 30d supply, fill #0
  Filled 2021-11-07: qty 30, 30d supply, fill #1

## 2021-10-06 MED ORDER — AMLODIPINE BESYLATE 10 MG PO TABS
10.0000 mg | ORAL_TABLET | Freq: Every day | ORAL | 2 refills | Status: DC
  Filled 2021-10-06: qty 30, 30d supply, fill #0
  Filled 2021-11-07: qty 30, 30d supply, fill #1
  Filled 2021-12-04: qty 30, 30d supply, fill #2
  Filled 2022-01-06: qty 30, 30d supply, fill #3

## 2021-10-06 MED ORDER — FLUTICASONE FUROATE-VILANTEROL 100-25 MCG/ACT IN AEPB
1.0000 | INHALATION_SPRAY | Freq: Every day | RESPIRATORY_TRACT | 2 refills | Status: DC
  Filled 2021-10-06: qty 60, 60d supply, fill #0
  Filled 2021-10-24: qty 60, 30d supply, fill #0
  Filled 2021-11-07 – 2021-11-19 (×2): qty 60, 30d supply, fill #1

## 2021-10-06 NOTE — Assessment & Plan Note (Signed)
Patient needs follow-up with hepatitis C clinic they have done several studies but awaiting ultrasound of abdomen

## 2021-10-06 NOTE — Assessment & Plan Note (Signed)
Cellulitis has resolved and wound is healing

## 2021-10-06 NOTE — Assessment & Plan Note (Signed)
Blood pressure poorly controlled we will add valsartan 160 mg daily and continue amlodipine and return to see clinical pharmacy 1 month

## 2021-10-06 NOTE — Assessment & Plan Note (Signed)
No additional antibiotics needed continue with Breo and as needed albuterol

## 2021-10-06 NOTE — Patient Instructions (Addendum)
Start valsartan 1 daily for blood pressure and stay on amlodipine 1 daily for blood pressure medicines were sent to pharmacy  No additional antibiotics needed  Finish prednisone medication take 1 daily till gone  To help sleep I recommend taking 10 mg of melatonin an hour before bedtime this is available over-the-counter  Follow-up with wound care clinic  Return to see Franky Macho our clinical pharmacist to follow-up on your blood pressure in 1 month see Dr. Delford Field in 3 months  Comenzar con valsartn 1 diario para la presin arterial y Educational psychologist con amlodipino 1 diario para la presin arterial. Los medicamentos se enviaron a Garment/textile technologist.  No se necesitan antibiticos adicionales  Termine la medicacin de prednisona, tome 1 dosis diaria hasta que se acabe  Para ayudar a dormir recomiendo tomar 10 mg de [employer] hora antes de [place], esto est disponible sin receta.  Seguimiento con la [place] de cuidado de heridas.  Regrese a ver a Immunologist, Patent examiner, para Education officer, environmental un seguimiento de su presin arterial en 1 mes. Consulte al Dr. Delford Field en 3 meses.

## 2021-10-08 ENCOUNTER — Other Ambulatory Visit: Payer: Self-pay

## 2021-10-09 ENCOUNTER — Encounter (HOSPITAL_BASED_OUTPATIENT_CLINIC_OR_DEPARTMENT_OTHER): Payer: Self-pay | Admitting: Internal Medicine

## 2021-10-10 NOTE — Progress Notes (Signed)
ASTOR, GENTLE ([id number]) Visit Report for 10/09/2021 Physician Orders Details Patient Name: Date of Service: Leveda Bradley UE 10/09/2021 3:00 PM Medical Record Number: [id number] Patient Account Number: [id number] Date of Birth/Sex: Treating RN: 09-Dec-1950 (71 y.o. 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 Edema Control - Lymphedema / SCD / Other Elevate legs to the level of the heart or above for 30 minutes daily and/or when sitting, a frequency of: - 3-4 times a day throughout the day. Avoid standing for long periods of time. Patient to wear own compression stockings every day. Exercise regularly Moisturize legs daily. - both legs every night before bed. Compression stocking or Garment 20-30 mm/Hg pressure to: - apply in the morning and remove at night. Wound Treatment Electronic Signature(s) Signed: 10/09/2021 4:35:07 PM By: Baltazar Najjar MD Signed: 10/10/2021 5:40:20 PM By: Shawn Stall RN, BSN Entered By: Shawn Stall on 10/09/2021 15:17:06 -------------------------------------------------------------------------------- Problem List Details Patient Name: Date of Service: Jerry Bradley, Jerry Bradley UE 10/09/2021 3:00 PM Medical Record Number: [id number] Patient Account Number: [id number] Date of Birth/Sex: Treating RN: 1950-01-28 (71 y.o. 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 with 07/17/2021 No Yes unspecified severity B18.2 Chronic viral hepatitis C 07/17/2021 No Yes Z89.611 Acquired absence of right leg above knee 07/17/2021 No Yes Inactive Problems Resolved Problems Electronic Signature(s) Signed: 10/09/2021 4:35:07 PM By: Baltazar Najjar MD Signed: 10/10/2021 5:40:20 PM By: Shawn Stall RN, BSN Entered By: Shawn Stall on 10/09/2021 15:16:03 -------------------------------------------------------------------------------- SuperBill Details Patient Name: Date of Service: Jerry Bradley, Jerry Bradley UE 10/09/2021 Medical Record Number: [id number] Patient Account Number: [id number] Date of Birth/Sex: Treating RN: [birth year] (71 y.o. 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

## 2021-10-10 NOTE — Progress Notes (Signed)
TWAIN, STENSETH ([id number]) Visit Report for 10/09/2021 Arrival Information Details Patient Name: Date of Service: Jerry Bradley UE 10/09/2021 3:00 PM Medical Record Number: [id number] Patient Account Number: [id number] Date of Birth/Sex: Treating RN: 02/21/1950 (71 y.o. [name], Yvonne Kendall Primary Care Sharla Tankard: Shan Levans Other Clinician: Referring Paisly Fingerhut: Treating Cherie Lasalle/Extender: Michelle Piper in Treatment: 12 Visit Information History Since Last Visit Added or deleted any medications: Yes Patient Arrived: Cane Any new allergies or adverse reactions: No Arrival Time: 15:04 Had a fall or experienced change in No Accompanied By: interpreter activities of daily living that may affect Transfer Assistance: None risk of falls: Patient Identification Verified: Yes Signs or symptoms of abuse/neglect since last visito No Secondary Verification Process Completed: Yes Hospitalized since last visit: No Patient Requires Transmission-Based Precautions: No Implantable device outside of the clinic excluding No Patient Has Alerts: Yes cellular tissue based products placed in the center Patient Alerts: Translator Required since last visit: Has Dressing in Place as Prescribed: Yes Has Compression in Place as Prescribed: Yes Pain Present Now: No Notes Per patient seen PCP last week has PNA. Electronic Signature(s) Signed: 10/10/2021 5:40:20 PM By: Shawn Stall RN, BSN Entered By: Shawn Stall on 10/09/2021 15:05:05 -------------------------------------------------------------------------------- Clinic Level of Care Assessment Details Patient Name: Date of Service: Jerry Bradley UE 10/09/2021 3:00 PM Medical Record Number: [id number] Patient Account Number: [id number] Date of Birth/Sex: Treating RN: 21-Jun-1950 (71 y.o. [name], [name] Primary Care Franca Stakes: Shan Levans Other Clinician: Referring Ivee Poellnitz: Treating Bart Ashford/Extender:  Michelle Piper in Treatment: 12 Clinic Level of Care Assessment Items TOOL 4 Quantity Score X- 1 0 Use when only an EandM is performed on FOLLOW-UP visit ASSESSMENTS - Nursing Assessment / Reassessment X- 1 10 Reassessment of Co-morbidities (includes updates in patient status) X- 1 5 Reassessment of Adherence to Treatment Plan ASSESSMENTS - Wound and Skin A ssessment / Reassessment X - Simple Wound Assessment / Reassessment - one wound 1 5 []  - 0 Complex Wound Assessment / Reassessment - multiple wounds X- 1 10 Dermatologic / Skin Assessment (not related to wound area) ASSESSMENTS - Focused Assessment X- 1 5 Circumferential Edema Measurements - multi extremities []  - 0 Nutritional Assessment / Counseling / Intervention []  - 0 Lower Extremity Assessment (monofilament, tuning fork, pulses) []  - 0 Peripheral Arterial Disease Assessment (using hand held doppler) ASSESSMENTS - Ostomy and/or Continence Assessment and Care []  - 0 Incontinence Assessment and Management []  - 0 Ostomy Care Assessment and Management (repouching, etc.) PROCESS - Coordination of Care []  - 0 Simple Patient / Family Education for ongoing care X- 1 20 Complex (extensive) Patient / Family Education for ongoing care X- 1 10 Staff obtains , Records, T Results / Process Orders est X- 1 10 Staff telephones HHA, Nursing Homes / Clarify orders / etc []  - 0 Routine Transfer to another Facility (non-emergent condition) []  - 0 Routine Hospital Admission (non-emergent condition) []  - 0 New Admissions / / Ordering NPWT Apligraf, etc. , []  - 0 Emergency Hospital Admission (emergent condition) X- 1 10 Simple Discharge Coordination []  - 0 Complex (extensive) Discharge Coordination PROCESS - Special Needs []  - 0 Pediatric / Minor Patient Management []  - 0 Isolation Patient Management []  - 0 Hearing / Language / Visual special needs []  -  0 Assessment of Community assistance (transportation, D/C planning, etc.) []  - 0 Additional assistance / Altered mentation []  - 0 Support Surface(s) Assessment (bed, cushion, seat, etc.) INTERVENTIONS - Wound Cleansing /  Measurement X - Simple Wound Cleansing - one wound 1 5 []  - 0 Complex Wound Cleansing - multiple wounds []  - 0 Wound Imaging (photographs - any number of wounds) []  - 0 Wound Tracing (instead of photographs) X- 1 5 Simple Wound Measurement - one wound []  - 0 Complex Wound Measurement - multiple wounds INTERVENTIONS - Wound Dressings []  - 0 Small Wound Dressing one or multiple wounds []  - 0 Medium Wound Dressing one or multiple wounds []  - 0 Large Wound Dressing one or multiple wounds []  - 0 Application of Medications - topical []  - 0 Application of Medications - injection INTERVENTIONS - Miscellaneous []  - 0 External ear exam []  - 0 Specimen Collection (cultures, biopsies, blood, body fluids, etc.) []  - 0 Specimen(s) / Culture(s) sent or taken to Lab for analysis []  - 0 Patient Transfer (multiple staff / / Similar devices) []  - 0 Simple Staple / Suture removal (25 or less) []  - 0 Complex Staple / Suture removal (26 or more) []  - 0 Hypo / Hyperglycemic Management (close monitor of Blood Glucose) []  - 0 Ankle / Brachial Index (ABI) - do not check if billed separately X- 1 5 Vital Signs Has the patient been seen at the hospital within the last three years: Yes Total Score: 100 Level Of Care: New/Established - Level 3 Electronic Signature(s) Signed: 10/10/2021 5:40:20 PM By: RN, BSN Entered By: on 10/09/2021 15:18:16 -------------------------------------------------------------------------------- Encounter Discharge Information Details Patient Name: Date of Service: , UE 10/09/2021 3:00 PM Medical Record Number: Patient Account Number: Date of Birth/Sex: Treating  RN: 23-Mar-1950 (71 y.o. Primary Care [name]: Nurse, adult Other Clinician: Referring Imre Vecchione: Treating Letonya Mangels/Extender: in Treatment: 12 Encounter Discharge Information Items Discharge Condition: Stable Ambulatory Status: Ambulatory Discharge Destination: Home Transportation: Private Auto Accompanied By: family/interpreter Schedule Follow-up Appointment: No Clinical Summary of Care: Electronic Signature(s) Signed: 10/10/2021 5:40:20 PM By: RN, BSN Entered By: on 10/09/2021 15:18:45 -------------------------------------------------------------------------------- Lower Extremity Assessment Details Patient Name: Date of Service: Shawn Stall, Shawn Stall UE 10/09/2021 3:00 PM Medical Record Number: [name] Patient Account Number: [name] Date of Birth/Sex: Treating RN: [birth year] (70 y.o. [id number] Primary Care Joclynn Lumb: 14/06/1950 Other Clinician: Referring Gabriana Wilmott: Treating Rayan Dyal/Extender: 75 in Treatment: 12 Edema Assessment Assessed: [name]: Yes] Shan Levans: No] Edema: [Left: N] [Right: o] Calf Left: Right: Point of Measurement: 34 cm From Medial Instep 33 cm Ankle Left: Right: Point of Measurement: 11 cm From Medial Instep 21 cm Vascular Assessment Pulses: Dorsalis Pedis Palpable: [Left:Yes] Electronic Signature(s) Signed: 10/10/2021 5:40:20 PM By: 10/12/2021 RN, BSN Entered By: Shawn Stall on 10/09/2021 15:10:12 -------------------------------------------------------------------------------- Multi-Disciplinary Care Plan Details Patient Name: Date of Service: 10/11/2021, [name] UE 10/09/2021 3:00 PM Medical Record Number: 10/11/2021 Patient Account Number: [id number] Date of Birth/Sex: Treating RN: 18-Mar-1950 (71 y.o. 75 Primary Care Fidencio Duddy: [name] Other Clinician: Referring Ryatt Corsino: Treating  Knolan Simien/Extender: Shan Levans in Treatment: 12 Active Inactive Electronic Signature(s) Signed: 10/10/2021 5:40:20 PM By: Kyra Searles RN, BSN Entered By: Franne Forts on 10/09/2021 15:17:33 -------------------------------------------------------------------------------- Pain Assessment Details Patient Name: Date of Service: Shawn Stall, Shawn Stall UE 10/09/2021 3:00 PM Medical Record Number: [name] Patient Account Number: [name] Date of Birth/Sex: Treating RN: [birth year] (71 y.o. [id number] Primary Care Laryssa Hassing: 14/06/1950 Other Clinician: Referring Tanaysha Alkins: Treating Wacey Zieger/Extender: 75 in  Treatment: 12 Active Problems Location of Pain Severity and Description of Pain Patient Has Paino No Site Locations Rate the pain. Rate the pain. Current Pain Level: 0 Pain Management and Medication Current Pain Management: Medication: No Cold Application: No Rest: No Massage: No Activity: No T.E.N.S.: No Heat Application: No Leg drop or elevation: No Is the Current Pain Management Adequate: Adequate How does your wound impact your activities of daily livingo Sleep: No Bathing: No Appetite: No Relationship With Others: No Bladder Continence: No Emotions: No Bowel Continence: No Work: No Toileting: No Drive: No Dressing: No Hobbies: No Notes Per patient at times a pain. Electronic Signature(s) Signed: 10/10/2021 5:40:20 PM By: Shawn Stall RN, BSN Entered By: Shawn Stall on 10/09/2021 15:05:17 -------------------------------------------------------------------------------- Patient/Caregiver Education Details Patient Name: Date of Service: Jerry Bradley UE 9/14/2023andnbsp3:00 PM Medical Record Number: [id number] Patient Account Number: [id number] Date of Birth/Gender: Treating RN: [birth year] (71 y.o. [name] Primary Care Physician: Shan Levans Other  Clinician: Referring Physician: Treating Physician/Extender: Michelle Piper in Treatment: 12 Education Assessment Education Provided To: Patient Education Topics Provided Wound/Skin Impairment: Handouts: Skin Care Do's and Dont's Methods: Explain/Verbal Responses: Reinforcements needed Electronic Signature(s) Signed: 10/10/2021 5:40:20 PM By: Shawn Stall RN, BSN Entered By: Shawn Stall on 10/09/2021 15:17:43 -------------------------------------------------------------------------------- Wound Assessment Details Patient Name: Date of Service: [name], [name] 10/09/2021 3:00 PM Medical Record Number: [id number] Patient Account Number: [id number] Date of Birth/Sex: Treating RN: 11-Nov-1950 (70 y.o. [name], [name] Primary Care Grettel Rames: Shan Levans Other Clinician: Referring Howard Patton: Treating Trasean Delima/Extender: Michelle Piper in Treatment: 12 Wound Status Wound Number: 1 Primary Venous Leg Ulcer Etiology: Wound Location: Left, Anterior Lower Leg Wound Open Wounding Event: Gradually Appeared Status: Date Acquired: 05/26/2021 Comorbid Asthma, Chronic Obstructive Pulmonary Disease (COPD), Weeks Of Treatment: 12 History: Hypertension, Hepatitis C Clustered Wound: Yes Photos Photo Uploaded By: Haywood Pao on 10/10/2021 17:35:38 Wound Measurements Length: (cm) Width: (cm) Depth: (cm) Area: (cm) Volume: (cm) 0 % Reduction in Area: 100% 0 % Reduction in Volume: 100% 0 Epithelialization: Large (67-100%) 0 Tunneling: No 0 Undermining: No Wound Description Classification: Full Thickness Without Exposed Support Structures Wound Margin: Distinct, outline attached Exudate Amount: None Present Foul Odor After Cleansing: No Slough/Fibrino No Wound Bed Granulation Amount: None Present (0%) Exposed Structure Necrotic Amount: None Present (0%) Fascia Exposed: No Fat Layer (Subcutaneous Tissue) Exposed:  No Tendon Exposed: No Muscle Exposed: No Joint Exposed: No Bone Exposed: No Electronic Signature(s) Signed: 10/10/2021 5:40:20 PM By: Shawn Stall RN, BSN Entered By: Shawn Stall on 10/09/2021 15:10:28 -------------------------------------------------------------------------------- Vitals Details Patient Name: Date of Service: [name], [name] 10/09/2021 3:00 PM Medical Record Number: [id number] Patient Account Number: [id number] Date of Birth/Sex: Treating RN: 1950/11/27 (70 y.o. [name], Yvonne Kendall Primary Care Taci Sterling: Shan Levans Other Clinician: Referring Taytum Scheck: Treating Krystn Dermody/Extender: Michelle Piper in Treatment: 12 Vital Signs Time Taken: 13:05 Temperature (F): 97.6 Height (in): 65 Pulse (bpm): 65 Respiratory Rate (breaths/min): 20 Blood Pressure (mmHg): 148/76 Reference Range: 80 - 120 mg / dl Electronic Signature(s) Signed: 10/10/2021 5:40:20 PM By: Shawn Stall RN, BSN Entered By: Shawn Stall on 10/09/2021 15:04:44

## 2021-10-24 ENCOUNTER — Other Ambulatory Visit: Payer: Self-pay

## 2021-10-24 ENCOUNTER — Other Ambulatory Visit: Payer: Self-pay | Admitting: Emergency Medicine

## 2021-10-25 MED ORDER — ALBUTEROL SULFATE HFA 108 (90 BASE) MCG/ACT IN AERS
2.0000 | INHALATION_SPRAY | Freq: Four times a day (QID) | RESPIRATORY_TRACT | 0 refills | Status: DC | PRN
  Filled 2021-10-25: qty 6.7, 25d supply, fill #0

## 2021-10-27 ENCOUNTER — Other Ambulatory Visit: Payer: Self-pay

## 2021-10-28 ENCOUNTER — Other Ambulatory Visit: Payer: Self-pay

## 2021-10-30 ENCOUNTER — Other Ambulatory Visit: Payer: Self-pay

## 2021-11-07 ENCOUNTER — Other Ambulatory Visit: Payer: Self-pay

## 2021-11-07 ENCOUNTER — Ambulatory Visit: Payer: Self-pay | Attending: Critical Care Medicine | Admitting: Pharmacist

## 2021-11-07 ENCOUNTER — Encounter: Payer: Self-pay | Admitting: Pharmacist

## 2021-11-07 VITALS — BP 103/55 | HR 66

## 2021-11-07 DIAGNOSIS — I10 Essential (primary) hypertension: Secondary | ICD-10-CM

## 2021-11-07 MED ORDER — VALSARTAN 80 MG PO TABS
80.0000 mg | ORAL_TABLET | Freq: Every day | ORAL | 2 refills | Status: DC
  Filled 2021-11-07: qty 30, 30d supply, fill #0
  Filled 2021-12-04: qty 30, 30d supply, fill #1
  Filled 2022-01-06: qty 30, 30d supply, fill #2

## 2021-11-07 MED ORDER — ALBUTEROL SULFATE HFA 108 (90 BASE) MCG/ACT IN AERS
INHALATION_SPRAY | RESPIRATORY_TRACT | 6 refills | Status: DC
  Filled 2021-11-07: qty 6.7, 25d supply, fill #0
  Filled 2021-12-04: qty 6.7, 25d supply, fill #1
  Filled 2022-01-06: qty 6.7, 25d supply, fill #2

## 2021-11-07 NOTE — Progress Notes (Signed)
S:     No chief complaint on file.  Jerry Bradley is a 71 y.o. male who presents for hypertension evaluation, education, and management.  PMH is significant for HTN, asthma, prior tobacco use, hx of R AKA.  Patient was referred and last seen by Primary Care Provider, Dr. Delford Field, on 10/06/2021.   At last visit, BP was 152/78. Pt was started on valsartan 160 mg daily. His amlodipine was continued.   Today, patient arrives in good spirits and presents without assistance.  Denies dizziness, headache, blurred vision, swelling. He does have some shortness of breath and demonstrates poor inhaler technique.   Patient reports hypertension is longstanding.   Family/Social history:  Fhx: none reported  Tobacco: former smoker (quit in 10/2020) Alcohol: none reported  Medication adherence reported, however, he ran out of valsartan 2 days ago. Patient has taken amlodipine today.   Current antihypertensives include: amlodipine 10 mg daily, valsartan 160 mg daily (not taking for the last couple of days)  Reported home BP readings: none  Patient reported dietary habits:  -Does not add salt at the table but does not know how much salt is added to his food during preparation  -Drinks coffee in the morning   Patient-reported exercise habits:  -None. Limited mobility S/p R AKA   O:  Vitals:   11/07/21 1026  BP: (!) 103/55  Pulse: 66     Last 3 Office BP readings: BP Readings from Last 3 Encounters:  11/07/21 (!) 103/55  10/06/21 (!) 152/78  10/03/21 137/68    BMET    Component Value Date/Time   NA 140 10/03/2021 0225   NA 136 07/02/2021 1117   K 3.9 10/03/2021 0225   CL 110 10/03/2021 0225   CO2 25 10/03/2021 0225   GLUCOSE 113 (H) 10/03/2021 0225   BUN 15 10/03/2021 0225   BUN 13 07/02/2021 1117   CREATININE 1.13 10/03/2021 0225   CALCIUM 8.9 10/03/2021 0225   GFRNONAA >60 10/03/2021 0225    Renal function: CrCl cannot be calculated (Patient's most recent lab  result is older than the maximum 21 days allowed.).  Clinical ASCVD: No  The 10-year ASCVD risk score (Arnett DK, et al., 2019) is: 10.5%   Values used to calculate the score:     Age: 47 years     Sex: Male     Is Non-Hispanic African American: No     Diabetic: No     Tobacco smoker: No     Systolic Blood Pressure: 103 mmHg     Is BP treated: Yes     HDL Cholesterol: 47 mg/dL     Total Cholesterol: 136 mg/dL  A/P: Hypertension diagnosed currently hypotensive on current medications. BP goal < 130 mmHg. Medication adherence appears appropriate but he ran out of valsartan ~2 days ago. Will need to decrease valsartan and consider discontinuation if hypotension persists.   -Discontinued valsartan 160 mg daily. Start valsartan 80 mg daily.  -Continue amlodipine 10 mg daily.  -Patient educated on purpose, proper use, and potential adverse effects of valsartan.  -F/u labs ordered - none today -Counseled on lifestyle modifications for blood pressure control including reduced dietary sodium, increased exercise, adequate sleep. -Encouraged patient to check BP at home and bring log of readings to next visit. Counseled on proper use of home BP cuff.    Results reviewed and written information provided.    Written patient instructions provided. Patient verbalized understanding of treatment plan.  Total time in face  to face counseling 30 minutes.    Follow-up:  Pharmacist in 1 month.  Benard Halsted, PharmD, [facility], [place] [phone]

## 2021-11-10 ENCOUNTER — Other Ambulatory Visit: Payer: Self-pay

## 2021-11-15 ENCOUNTER — Emergency Department (HOSPITAL_COMMUNITY): Payer: Self-pay

## 2021-11-15 ENCOUNTER — Encounter (HOSPITAL_COMMUNITY): Payer: Self-pay

## 2021-11-15 ENCOUNTER — Other Ambulatory Visit: Payer: Self-pay

## 2021-11-15 ENCOUNTER — Emergency Department (HOSPITAL_COMMUNITY)
Admission: EM | Admit: 2021-11-15 | Discharge: 2021-11-15 | Disposition: A | Discharge status: Home / Self Care | Payer: Self-pay | Attending: Emergency Medicine | Admitting: Emergency Medicine

## 2021-11-15 DIAGNOSIS — R059 Cough, unspecified: Secondary | ICD-10-CM | POA: Insufficient documentation

## 2021-11-15 DIAGNOSIS — R0602 Shortness of breath: Secondary | ICD-10-CM | POA: Insufficient documentation

## 2021-11-15 DIAGNOSIS — Z7951 Long term (current) use of inhaled steroids: Secondary | ICD-10-CM | POA: Insufficient documentation

## 2021-11-15 DIAGNOSIS — J45901 Unspecified asthma with (acute) exacerbation: Secondary | ICD-10-CM

## 2021-11-15 DIAGNOSIS — J45909 Unspecified asthma, uncomplicated: Secondary | ICD-10-CM | POA: Insufficient documentation

## 2021-11-15 HISTORY — DX: Pneumonia, unspecified organism: J18.9

## 2021-11-15 LAB — CBC
HCT: 43.2 % (ref 39.0–52.0)
Hemoglobin: 13.4 g/dL (ref 13.0–17.0)
MCH: 26.6 pg (ref 26.0–34.0)
MCHC: 31 g/dL (ref 30.0–36.0)
MCV: 85.7 fL (ref 80.0–100.0)
Platelets: 431 10*3/uL — ABNORMAL HIGH (ref 150–400)
RBC: 5.04 MIL/uL (ref 4.22–5.81)
RDW: 13.9 % (ref 11.5–15.5)
WBC: 9 10*3/uL (ref 4.0–10.5)
nRBC: 0 % (ref 0.0–0.2)

## 2021-11-15 LAB — COMPREHENSIVE METABOLIC PANEL
ALT: 32 U/L (ref 0–44)
AST: 37 U/L (ref 15–41)
Albumin: 4.5 g/dL (ref 3.5–5.0)
Alkaline Phosphatase: 63 U/L (ref 38–126)
Anion gap: 6 (ref 5–15)
BUN: 14 mg/dL (ref 8–23)
CO2: 25 mmol/L (ref 22–32)
Calcium: 9.1 mg/dL (ref 8.9–10.3)
Chloride: 107 mmol/L (ref 98–111)
Creatinine, Ser: 0.91 mg/dL (ref 0.61–1.24)
GFR, Estimated: >60 mL/min (ref >60)
Glucose, Bld: 96 mg/dL (ref 70–99)
Potassium: 3.9 mmol/L (ref 3.5–5.1)
Sodium: 138 mmol/L (ref 135–145)
Total Bilirubin: 0.5 mg/dL (ref 0.3–1.2)
Total Protein: 8.3 g/dL — ABNORMAL HIGH (ref 6.5–8.1)

## 2021-11-15 MED ORDER — ALBUTEROL SULFATE HFA 108 (90 BASE) MCG/ACT IN AERS
2.0000 | INHALATION_SPRAY | RESPIRATORY_TRACT | 2 refills | Status: DC | PRN
  Filled 2021-11-15: qty 1, fill #0

## 2021-11-15 MED ORDER — IPRATROPIUM BROMIDE 0.02 % IN SOLN
0.5000 mg | Freq: Once | RESPIRATORY_TRACT | Status: AC
  Administered 2021-11-15: 0.5 mg via RESPIRATORY_TRACT
  Filled 2021-11-15: qty 2.5

## 2021-11-15 MED ORDER — ALBUTEROL SULFATE HFA 108 (90 BASE) MCG/ACT IN AERS
2.0000 | INHALATION_SPRAY | RESPIRATORY_TRACT | Status: DC
  Administered 2021-11-15: 2 via RESPIRATORY_TRACT
  Filled 2021-11-15: qty 6.7

## 2021-11-15 MED ORDER — PREDNISONE 10 MG PO TABS
ORAL_TABLET | Freq: Every day | ORAL | 0 refills | Status: DC
Start: 2021-11-15 — End: 2021-12-12
  Filled 2021-11-15: qty 42, 12d supply, fill #0

## 2021-11-15 MED ORDER — PREDNISONE 20 MG PO TABS
60.0000 mg | ORAL_TABLET | Freq: Once | ORAL | Status: AC
  Administered 2021-11-15: 60 mg via ORAL
  Filled 2021-11-15: qty 3

## 2021-11-15 MED ORDER — ALBUTEROL SULFATE (2.5 MG/3ML) 0.083% IN NEBU
10.0000 mg/h | INHALATION_SOLUTION | Freq: Once | RESPIRATORY_TRACT | Status: AC
  Administered 2021-11-15: 10 mg/h via RESPIRATORY_TRACT
  Filled 2021-11-15 (×2): qty 12

## 2021-11-15 NOTE — ED Provider Notes (Signed)
[facility] DEPT Provider Note   CSN: [id number] Arrival date & time: 11/15/21  1409     History  Chief Complaint  Patient presents with   Shortness of Breath   Cough    Jerry Bradley is a 71 y.o. male.  71 year old male presents with cough and shortness of breath.  History of asthma and has been using his inhaler at home with limited relief.  Ran out of it recently.  Denies any fever or chills.  Cough is worse at night somewhat.  No vomiting or diarrhea.  No anginal or CHF type symptoms.       Home Medications Prior to Admission medications   Medication Sig Start Date End Date Taking? Authorizing Provider  albuterol (VENTOLIN HFA) 108 (90 Base) MCG/ACT inhaler Inhale 2 inhalaciones hacia los pulmones cada 4 (quatro) horas segn sea necesario para las sibilancias o la dificultad para respirar. 11/07/21   Elsie Stain, MD  amLODipine (NORVASC) 10 MG tablet Take 1 tablet (10 mg total) by mouth daily. 10/06/21   Elsie Stain, MD  Bismuth Tribromoph-Petrolatum (XEROFORM OCCLUSIVE GAUZE PATCH) PADS Use as directed. 07/02/21   Elsie Stain, MD  fluticasone furoate-vilanterol (BREO ELLIPTA) 100-25 MCG/ACT AEPB Inhale 1 puff into the lungs daily. 10/06/21   Elsie Stain, MD  HYDROcodone-acetaminophen (NORCO/VICODIN) 5-325 MG tablet Take 1-2 tablets by mouth every 4 hours as needed for moderate pain. 07/02/21   Elsie Stain, MD  predniSONE (DELTASONE) 10 MG tablet Take 2 tablets (20 mg total) by mouth 2 (two) times daily with a meal. 10/03/21   Veryl Speak, MD  valsartan (DIOVAN) 80 MG tablet Take 1 tablet (80 mg total) by mouth daily. 11/07/21   Elsie Stain, MD      Allergies    Patient has no known allergies.    Review of Systems   Review of Systems  All other systems reviewed and are negative.   Physical Exam Updated Vital Signs BP (!) 142/71 (BP Location: Left Arm)   Pulse (!) 56   Temp 97.6 F (36.4 C) (Oral)    Resp 18   Ht 1.72 m (5' 7.72")   Wt 62 kg   SpO2 97%   BMI 20.96 kg/m  Physical Exam Vitals and nursing note reviewed.  Constitutional:      General: He is not in acute distress.    Appearance: Normal appearance. He is well-developed. He is not toxic-appearing.  HENT:     Head: Normocephalic and atraumatic.  Eyes:     General: Lids are normal.     Conjunctiva/sclera: Conjunctivae normal.     Pupils: Pupils are equal, round, and reactive to light.  Neck:     Thyroid: No thyroid mass.     Trachea: No tracheal deviation.  Cardiovascular:     Rate and Rhythm: Normal rate and regular rhythm.     Heart sounds: Normal heart sounds. No murmur heard.    No gallop.  Pulmonary:     Effort: Pulmonary effort is normal. No respiratory distress.     Breath sounds: No stridor. Examination of the right-upper field reveals wheezing. Examination of the left-upper field reveals wheezing. Wheezing present. No decreased breath sounds, rhonchi or rales.  Abdominal:     General: There is no distension.     Palpations: Abdomen is soft.     Tenderness: There is no abdominal tenderness. There is no rebound.  Musculoskeletal:        General:  No tenderness. Normal range of motion.     Cervical back: Normal range of motion and neck supple.  Skin:    General: Skin is warm and dry.     Findings: No abrasion or rash.  Neurological:     Mental Status: He is alert and oriented to person, place, and time. Mental status is at baseline.     GCS: GCS eye subscore is 4. GCS verbal subscore is 5. GCS motor subscore is 6.     Cranial Nerves: No cranial nerve deficit.     Sensory: No sensory deficit.     Motor: Motor function is intact.  Psychiatric:        Attention and Perception: Attention normal.        Speech: Speech normal.        Behavior: Behavior normal.     ED Results / Procedures / Treatments   Labs (all labs ordered are listed, but only abnormal results are displayed) Labs Reviewed  CBC -  Abnormal; Notable for the following components:      Result Value   Platelets 431 (*)    All other components within normal limits  COMPREHENSIVE METABOLIC PANEL    EKG None  Radiology DG Chest 2 View  Result Date: 11/15/2021 CLINICAL DATA:  Cough, shortness of breath x1 month EXAM: CHEST - 2 VIEW COMPARISON:  10/03/2021 FINDINGS: Cardiac size is within normal limits. There are no signs of pulmonary edema. There is interval improvement in aeration of both lower lung fields. There is no focal pulmonary consolidation. There is no pleural effusion or pneumothorax. Pleural densities are seen in the apices. IMPRESSION: No active cardiopulmonary disease. Electronically Signed   By: Ernie Avena M.D.   On: 11/15/2021 15:44    Procedures Procedures    Medications Ordered in ED Medications  albuterol (PROVENTIL,VENTOLIN) solution continuous neb (has no administration in time range)  ipratropium (ATROVENT) nebulizer solution 0.5 mg (has no administration in time range)  predniSONE (DELTASONE) tablet 60 mg (has no administration in time range)    ED Course/ Medical Decision Making/ A&P                           Medical Decision Making Risk Prescription drug management.   Spanish interpreter used for this visit.  Patient's chest x-ray per my interpretation shows no acute findings.  Mild wheezing noted on exam.  Treat with albuterol and prednisone and feels much better at this time.  Will discharge home at this time.  Will give albuterol inhaler to go with her as well as place on prednisone taper.  Considered other etiologies of her shortness of breath such as PE but feel less likely given he did have reversible bronchospasm.        Final Clinical Impression(s) / ED Diagnoses Final diagnoses:  None    Rx / DC Orders ED Discharge Orders     None         Lorre Nick, MD 11/15/21 1952

## 2021-11-15 NOTE — ED Triage Notes (Addendum)
Patient c/o SOB and states he was diagnosed with pneumonia approx 1 month and states he was given a prescription at that time.  Patient states he went to see his PCP and was prescribed more antibiotics.  Patient states he is out of is Albuterol inhaler

## 2021-11-15 NOTE — ED Provider Triage Note (Signed)
Emergency Medicine Provider Triage Evaluation Note  Jerry Bradley , a 71 y.o. male  was evaluated in triage.  Pt complains of cough and shortness of breath x 1 month. Was diagnosed with pneumonia about a month ago in the ER (9/7), was placed on antibiotics (zpack and prednisone). Went to his PCP afterwards and and had refill of his inhaler. Continues to have cough and SOB, worsened the past 4-5 days. Ran out of inhaler last night. The cough is making it difficult to sleep at night.   Hx asthma, hep C. Spanish interpreter used in triage   Review of Systems  Positive: Cough, SOB Negative: Fever, CP  Physical Exam  BP (!) 142/68 (BP Location: Left Arm)   Pulse 66   Resp (!) 24   Ht 5' 7.72" (1.72 m)   Wt 62 kg   SpO2 97%   BMI 20.96 kg/m  Gen:   Awake, no distress   Resp:  Normal effort  MSK:   Moves extremities without difficulty  Other:    Medical Decision Making  Medically screening exam initiated at 3:16 PM.  Appropriate orders placed.  Maurine Simmering was informed that the remainder of the evaluation will be completed by another provider, this initial triage assessment does not replace that evaluation, and the importance of remaining in the ED until their evaluation is complete.  Workup initiated    Breeona Waid T, PA-C 11/15/21 1523

## 2021-11-17 ENCOUNTER — Other Ambulatory Visit: Payer: Self-pay

## 2021-11-19 ENCOUNTER — Other Ambulatory Visit: Payer: Self-pay

## 2021-12-04 ENCOUNTER — Other Ambulatory Visit: Payer: Self-pay

## 2021-12-09 ENCOUNTER — Other Ambulatory Visit: Payer: Self-pay | Admitting: Emergency Medicine

## 2021-12-09 ENCOUNTER — Other Ambulatory Visit: Payer: Self-pay

## 2021-12-12 ENCOUNTER — Ambulatory Visit: Payer: Self-pay | Attending: Critical Care Medicine | Admitting: Pharmacist

## 2021-12-12 ENCOUNTER — Other Ambulatory Visit: Payer: Self-pay

## 2021-12-12 ENCOUNTER — Telehealth: Payer: Self-pay | Admitting: Pharmacist

## 2021-12-12 ENCOUNTER — Encounter: Payer: Self-pay | Admitting: Pharmacist

## 2021-12-12 VITALS — BP 112/58 | HR 66

## 2021-12-12 DIAGNOSIS — I10 Essential (primary) hypertension: Secondary | ICD-10-CM

## 2021-12-12 DIAGNOSIS — J4540 Moderate persistent asthma, uncomplicated: Secondary | ICD-10-CM

## 2021-12-12 MED ORDER — FLUTICASONE FUROATE-VILANTEROL 200-25 MCG/ACT IN AEPB
1.0000 | INHALATION_SPRAY | Freq: Every day | RESPIRATORY_TRACT | 11 refills | Status: DC
  Filled 2021-12-12: qty 60, 30d supply, fill #0
  Filled 2022-01-16: qty 60, 30d supply, fill #1
  Filled 2022-02-10: qty 60, 30d supply, fill #2
  Filled 2022-03-17: qty 60, 30d supply, fill #3
  Filled 2022-04-14: qty 60, 30d supply, fill #4
  Filled 2022-05-22: qty 60, 30d supply, fill #5
  Filled 2022-06-16: qty 60, 30d supply, fill #6

## 2021-12-12 MED ORDER — PREDNISONE 10 MG PO TABS
ORAL_TABLET | Freq: Every day | ORAL | 0 refills | Status: DC
  Filled 2021-12-12: qty 42, 12d supply, fill #0

## 2021-12-12 NOTE — Progress Notes (Signed)
S:     No chief complaint on file.  Jerry Bradley is a 71 y.o. male who presents for hypertension evaluation, education, and management.  PMH is significant for HTN, asthma, prior tobacco use, hx of R AKA.  Patient was referred and last seen by Primary Care Provider, Dr. Delford Field, on 10/06/2021.   I saw him last month. His BP was 103/55 mmHg. I decreased his valsartan from 160 mg daily to 80 mg daily. We continued his amlodipine.   Today, patient arrives in good spirits and presents without assistance.  Denies dizziness, headache, blurred vision, swelling. He does have some shortness of breath. He was seen in the ED last month for cough/asthma exacerbation. He was prescribed prednisone. He is requesting a refill on this and his Breo today.   Patient reports hypertension is longstanding.   Family/Social history:  Fhx: none reported  Tobacco: former smoker (quit in 10/2020) Alcohol: none reported  Medication adherence reported.   Current antihypertensives include: amlodipine 10 mg daily, valsartan 80 mg daily  Reported home BP readings: none  Patient reported dietary habits:  -Does not add salt at the table but does not know how much salt is added to his food during preparation  -Drinks coffee in the morning   Patient-reported exercise habits:  -None. Limited mobility S/p R AKA   O:  Vitals:   12/12/21 1359  BP: (!) 112/58  Pulse: 66    Last 3 Office BP readings: BP Readings from Last 3 Encounters:  12/12/21 (!) 112/58  11/15/21 134/63  11/07/21 (!) 103/55    BMET    Component Value Date/Time   NA 138 11/15/2021 1656   NA 136 07/02/2021 1117   K 3.9 11/15/2021 1656   CL 107 11/15/2021 1656   CO2 25 11/15/2021 1656   GLUCOSE 96 11/15/2021 1656   BUN 14 11/15/2021 1656   BUN 13 07/02/2021 1117   CREATININE 0.91 11/15/2021 1656   CALCIUM 9.1 11/15/2021 1656   GFRNONAA >60 11/15/2021 1656    Renal function: CrCl cannot be calculated (Patient's most  recent lab result is older than the maximum 21 days allowed.).  Clinical ASCVD: No  The 10-year ASCVD risk score (Arnett DK, et al., 2019) is: 12.1%   Values used to calculate the score:     Age: 43 years     Sex: Male     Is Non-Hispanic African American: No     Diabetic: No     Tobacco smoker: No     Systolic Blood Pressure: 112 mmHg     Is BP treated: Yes     HDL Cholesterol: 47 mg/dL     Total Cholesterol: 136 mg/dL  A/P: Hypertension diagnosed currently hypotensive given DBP of 58 mmHg on current medications. BP goal < 130 mmHg. Medication adherence appears appropriate. He denies any dizziness or lightheadedness at home. Will continue his home regimen and continue to monitor.  -Continue valsartan 80 mg daily.  -Continue amlodipine 10 mg daily.  -F/u labs ordered - none today -Counseled on lifestyle modifications for blood pressure control including reduced dietary sodium, increased exercise, adequate sleep. -Encouraged patient to check BP at home and bring log of readings to next visit. Counseled on proper use of home BP cuff.  -Refills sent for prednisone and Breo per patient request.    Results reviewed and written information provided.    Written patient instructions provided. Patient verbalized understanding of treatment plan.  Total time in face to face  counseling 30 minutes.    Follow-up:  PCP 01/06/2022.  Butch Penny, PharmD, Patsy [place], CPP Clinical Pharmacist [facility] [phone]

## 2021-12-12 NOTE — Telephone Encounter (Signed)
Yes no problem ty

## 2021-12-12 NOTE — Telephone Encounter (Signed)
Dr. Delford Field,   Wanted to send this to you for your help whenever you return from leave. Not urgent as we have surplus in our pharmacy.   I've been seeing this patient for HTN management. Went over inhaler technique ~30 minutes during one of those visits. He has improved but tells me he is  still experiencing some wheezing, dyspnea. COPD/Asthma is on my CPP protocol. I went ahead and increased his Breo dose to 200-25 mcg/daily. Hopefully this will help him with symptoms and to cut down on SABA use until he sees you in clinic.   With that being said, we need a signed hard-copy Breo rx for PASS use. Again, not urgent because we have surplus. But I cannot sign PASS hard copies. Are you able to print and sign one when you return? This can go in [facility] folder or you can hand it off to me and I'll take it downstairs in the future.

## 2021-12-17 ENCOUNTER — Other Ambulatory Visit: Payer: Self-pay

## 2021-12-17 MED ORDER — FLUTICASONE FUROATE-VILANTEROL 200-25 MCG/ACT IN AEPB: 1.0000 | INHALATION_SPRAY | Freq: Every day | RESPIRATORY_TRACT | 11 refills | Status: DC

## 2021-12-17 NOTE — Telephone Encounter (Signed)
Jerry Bradley , Jerry Bradley  I printed a Rx for the new [facility] it is on nursing area printer   Jerry Bradley can you send this to [facility] in pharmacy downstairs  with my signature attached

## 2021-12-17 NOTE — Telephone Encounter (Signed)
Has been signed and taken to pharmacy for [facility]

## 2021-12-17 NOTE — Addendum Note (Signed)
Addended by: Storm Frisk on: 12/17/2021 09:12 AM   Modules accepted: Orders

## 2021-12-26 ENCOUNTER — Other Ambulatory Visit: Payer: Self-pay
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
## 2022-01-06 ENCOUNTER — Ambulatory Visit: Payer: Self-pay | Attending: Critical Care Medicine | Admitting: Critical Care Medicine

## 2022-01-06 ENCOUNTER — Telehealth: Payer: Self-pay

## 2022-01-06 ENCOUNTER — Other Ambulatory Visit: Payer: Self-pay | Admitting: Critical Care Medicine

## 2022-01-06 ENCOUNTER — Encounter: Payer: Self-pay | Admitting: Critical Care Medicine

## 2022-01-06 ENCOUNTER — Other Ambulatory Visit: Payer: Self-pay

## 2022-01-06 VITALS — BP 116/64 | HR 69 | Temp 97.7°F | Wt 157.8 lb

## 2022-01-06 DIAGNOSIS — I10 Essential (primary) hypertension: Secondary | ICD-10-CM

## 2022-01-06 DIAGNOSIS — J4540 Moderate persistent asthma, uncomplicated: Secondary | ICD-10-CM

## 2022-01-06 DIAGNOSIS — Z1211 Encounter for screening for malignant neoplasm of colon: Secondary | ICD-10-CM

## 2022-01-06 DIAGNOSIS — F17200 Nicotine dependence, unspecified, uncomplicated: Secondary | ICD-10-CM

## 2022-01-06 DIAGNOSIS — Z87891 Personal history of nicotine dependence: Secondary | ICD-10-CM

## 2022-01-06 MED ORDER — ALBUTEROL SULFATE HFA 108 (90 BASE) MCG/ACT IN AERS
INHALATION_SPRAY | RESPIRATORY_TRACT | 6 refills | Status: DC
Start: 2022-01-06 — End: 2022-08-18
  Filled 2022-01-16: qty 6.7, 25d supply, fill #0
  Filled 2022-02-10: qty 6.7, 25d supply, fill #1
  Filled 2022-03-17: qty 6.7, 25d supply, fill #2
  Filled 2022-04-14: qty 6.7, 25d supply, fill #3
  Filled 2022-05-22: qty 6.7, 25d supply, fill #4
  Filled 2022-06-16: qty 6.7, 17d supply, fill #5
  Filled 2022-07-17: qty 6.7, 17d supply, fill #6

## 2022-01-06 MED ORDER — AMLODIPINE BESYLATE 10 MG PO TABS
10.0000 mg | ORAL_TABLET | Freq: Every day | ORAL | 2 refills | Status: DC
  Filled 2022-01-16: qty 30, 30d supply, fill #0
  Filled 2022-03-17: qty 30, 30d supply, fill #1
  Filled 2022-04-14: qty 30, 30d supply, fill #2
  Filled 2022-05-22: qty 30, 30d supply, fill #3
  Filled 2022-07-17: qty 30, 30d supply, fill #4
  Filled 2022-08-18 – 2022-12-04 (×2): qty 30, 30d supply, fill #5

## 2022-01-06 MED ORDER — VALSARTAN 80 MG PO TABS
80.0000 mg | ORAL_TABLET | Freq: Every day | ORAL | 4 refills | Status: DC
  Filled 2022-01-16: qty 30, 30d supply, fill #0
  Filled 2022-03-17: qty 30, 30d supply, fill #1
  Filled 2022-04-14: qty 30, 30d supply, fill #2
  Filled 2022-05-22: qty 30, 30d supply, fill #3
  Filled 2022-07-17: qty 30, 30d supply, fill #4
  Filled 2022-08-18 – 2022-12-04 (×2): qty 30, 30d supply, fill #5

## 2022-01-06 NOTE — Assessment & Plan Note (Signed)
No longer smoking 

## 2022-01-06 NOTE — Patient Instructions (Addendum)
Colon cancer screening kit will be processed pick it up at the lab  Refills on all medications sent to your pharmacy no changes made  Case management will discuss with you potential ways of getting Medicare  Flu vaccine was given  Return to Dr. Joya Gaskins 5 months  Se procesar kit de deteccin de cncer de colon, recjalo en el laboratorio.  Resurtidos de [employer] enviados a su farmacia sin cambios  La administracin de casos discutir con usted posibles formas de [employer].  Se puso la vacuna contra la gripe  Regresar al Dr. Joya Gaskins 5 meses

## 2022-01-06 NOTE — Assessment & Plan Note (Signed)
Blood pressure well controlled at this time continue with amlodipine 10 mg dailyAnd valsartan 80 mg daily

## 2022-01-06 NOTE — Telephone Encounter (Signed)
I met with the patient when he was in the clinic today.  Spanish interpreter, [phone] with [employer] assisted.  The patient is interested in obtaining an ID.  I provided him with the contact number for [employer] to inquire when/where he can obtain an ID.  I explained that this is not a government issued ID.   I also explained that after he obtains the ID, he should apply for [place] Financial [place]/ [place]. He has been in the [place] for 12 years but is not a [place] citizen

## 2022-01-06 NOTE — Assessment & Plan Note (Addendum)
Continue with Breo and albuterol With recent bronchitis flare we will check chest x-ray

## 2022-01-07 ENCOUNTER — Other Ambulatory Visit: Payer: Self-pay

## 2022-01-12 ENCOUNTER — Other Ambulatory Visit: Payer: Self-pay

## 2022-01-13 ENCOUNTER — Other Ambulatory Visit: Payer: Self-pay

## 2022-01-15 ENCOUNTER — Other Ambulatory Visit: Payer: Self-pay

## 2022-01-16 ENCOUNTER — Other Ambulatory Visit: Payer: Self-pay

## 2022-02-06 ENCOUNTER — Other Ambulatory Visit (HOSPITAL_COMMUNITY): Payer: Self-pay

## 2022-02-10 ENCOUNTER — Other Ambulatory Visit: Payer: Self-pay

## 2022-03-17 ENCOUNTER — Other Ambulatory Visit: Payer: Self-pay

## 2022-04-14 ENCOUNTER — Other Ambulatory Visit: Payer: Self-pay

## 2022-05-01 ENCOUNTER — Other Ambulatory Visit: Payer: Self-pay

## 2022-05-22 ENCOUNTER — Other Ambulatory Visit: Payer: Self-pay
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
## 2022-06-09 ENCOUNTER — Ambulatory Visit: Payer: Self-pay | Admitting: Critical Care Medicine

## 2022-06-16 ENCOUNTER — Other Ambulatory Visit: Payer: Self-pay

## 2022-06-23 ENCOUNTER — Other Ambulatory Visit: Payer: Self-pay

## 2022-07-03 ENCOUNTER — Other Ambulatory Visit: Payer: Self-pay

## 2022-07-17 ENCOUNTER — Other Ambulatory Visit: Payer: Self-pay

## 2022-07-17 ENCOUNTER — Other Ambulatory Visit: Payer: Self-pay | Admitting: Pharmacist

## 2022-07-17 DIAGNOSIS — J4540 Moderate persistent asthma, uncomplicated: Secondary | ICD-10-CM

## 2022-07-17 MED ORDER — FLUTICASONE FUROATE-VILANTEROL 200-25 MCG/ACT IN AEPB
1.0000 | INHALATION_SPRAY | Freq: Every day | RESPIRATORY_TRACT | 2 refills | Status: DC
  Filled 2022-07-17: qty 60, 30d supply, fill #0
  Filled 2022-08-18: qty 60, 30d supply, fill #1
  Filled 2022-09-18: qty 60, 30d supply, fill #2

## 2022-08-18 ENCOUNTER — Other Ambulatory Visit: Payer: Self-pay

## 2022-08-18 ENCOUNTER — Other Ambulatory Visit: Payer: Self-pay | Admitting: Critical Care Medicine

## 2022-08-19 ENCOUNTER — Other Ambulatory Visit: Payer: Self-pay

## 2022-08-19 ENCOUNTER — Ambulatory Visit: Payer: Self-pay | Admitting: Critical Care Medicine

## 2022-08-19 MED ORDER — ALBUTEROL SULFATE HFA 108 (90 BASE) MCG/ACT IN AERS
INHALATION_SPRAY | RESPIRATORY_TRACT | 0 refills | Status: DC
Start: 2022-08-19 — End: 2022-12-23
  Filled 2022-08-19: qty 6.7, 25d supply, fill #0
  Filled 2022-09-18: qty 6.7, 17d supply, fill #0
  Filled 2022-12-04: qty 6.7, 25d supply, fill #0
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
## 2022-08-25 ENCOUNTER — Other Ambulatory Visit: Payer: Self-pay

## 2022-08-26 ENCOUNTER — Other Ambulatory Visit: Payer: Self-pay

## 2022-09-18 ENCOUNTER — Other Ambulatory Visit: Payer: Self-pay

## 2022-09-25 ENCOUNTER — Other Ambulatory Visit: Payer: Self-pay

## 2022-10-26 ENCOUNTER — Other Ambulatory Visit: Payer: Self-pay

## 2022-10-26 ENCOUNTER — Other Ambulatory Visit: Payer: Self-pay | Admitting: Critical Care Medicine

## 2022-10-26 ENCOUNTER — Other Ambulatory Visit: Payer: Self-pay | Admitting: Pharmacist

## 2022-10-26 DIAGNOSIS — J4540 Moderate persistent asthma, uncomplicated: Secondary | ICD-10-CM

## 2022-10-26 MED ORDER — FLUTICASONE FUROATE-VILANTEROL 200-25 MCG/ACT IN AEPB
1.0000 | INHALATION_SPRAY | Freq: Every day | RESPIRATORY_TRACT | 2 refills | Status: DC
  Filled 2022-10-26: qty 60, 30d supply, fill #0
  Filled 2022-12-04: qty 60, 30d supply, fill #1

## 2022-11-02 ENCOUNTER — Other Ambulatory Visit: Payer: Self-pay

## 2022-11-10 ENCOUNTER — Ambulatory Visit: Payer: Self-pay | Admitting: Internal Medicine

## 2022-11-12 ENCOUNTER — Other Ambulatory Visit: Payer: Self-pay

## 2022-12-04 ENCOUNTER — Other Ambulatory Visit: Payer: Self-pay

## 2022-12-04 ENCOUNTER — Other Ambulatory Visit: Payer: Self-pay | Admitting: Pharmacist

## 2022-12-04 ENCOUNTER — Emergency Department (HOSPITAL_COMMUNITY): Payer: Self-pay

## 2022-12-04 ENCOUNTER — Encounter (HOSPITAL_COMMUNITY): Payer: Self-pay

## 2022-12-04 ENCOUNTER — Other Ambulatory Visit (HOSPITAL_COMMUNITY): Payer: Self-pay

## 2022-12-04 ENCOUNTER — Emergency Department (HOSPITAL_COMMUNITY)
Admission: EM | Admit: 2022-12-04 | Discharge: 2022-12-05 | Discharge status: Against Medical Advice | Payer: Self-pay | Attending: Emergency Medicine | Admitting: Emergency Medicine

## 2022-12-04 DIAGNOSIS — Z20822 Contact with and (suspected) exposure to covid-19: Secondary | ICD-10-CM | POA: Insufficient documentation

## 2022-12-04 DIAGNOSIS — R059 Cough, unspecified: Secondary | ICD-10-CM | POA: Insufficient documentation

## 2022-12-04 DIAGNOSIS — Z5321 Procedure and treatment not carried out due to patient leaving prior to being seen by health care provider: Secondary | ICD-10-CM | POA: Insufficient documentation

## 2022-12-04 DIAGNOSIS — R079 Chest pain, unspecified: Secondary | ICD-10-CM | POA: Insufficient documentation

## 2022-12-04 DIAGNOSIS — R0602 Shortness of breath: Secondary | ICD-10-CM | POA: Insufficient documentation

## 2022-12-04 LAB — CBC WITH DIFFERENTIAL/PLATELET
Abs Immature Granulocytes: 0.03 10*3/uL (ref 0.00–0.07)
Basophils Absolute: 0.1 10*3/uL (ref 0.0–0.1)
Basophils Relative: 2 %
Eosinophils Absolute: 0.4 10*3/uL (ref 0.0–0.5)
Eosinophils Relative: 5 %
HCT: 39.8 % (ref 39.0–52.0)
Hemoglobin: 12.4 g/dL — ABNORMAL LOW (ref 13.0–17.0)
Immature Granulocytes: 0 %
Lymphocytes Relative: 13 %
Lymphs Abs: 1.1 10*3/uL (ref 0.7–4.0)
MCH: 24.6 pg — ABNORMAL LOW (ref 26.0–34.0)
MCHC: 31.2 g/dL (ref 30.0–36.0)
MCV: 79 fL — ABNORMAL LOW (ref 80.0–100.0)
Monocytes Absolute: 0.6 10*3/uL (ref 0.1–1.0)
Monocytes Relative: 8 %
Neutro Abs: 5.7 10*3/uL (ref 1.7–7.7)
Neutrophils Relative %: 72 %
Platelets: 375 10*3/uL (ref 150–400)
RBC: 5.04 MIL/uL (ref 4.22–5.81)
RDW: 17.1 % — ABNORMAL HIGH (ref 11.5–15.5)
WBC: 8 10*3/uL (ref 4.0–10.5)
nRBC: 0 % (ref 0.0–0.2)

## 2022-12-04 LAB — BASIC METABOLIC PANEL
Anion gap: 11 (ref 5–15)
BUN: 12 mg/dL (ref 8–23)
CO2: 18 mmol/L — ABNORMAL LOW (ref 22–32)
Calcium: 9.1 mg/dL (ref 8.9–10.3)
Chloride: 107 mmol/L (ref 98–111)
Creatinine, Ser: 0.8 mg/dL (ref 0.61–1.24)
GFR, Estimated: >60 mL/min (ref >60)
Glucose, Bld: 93 mg/dL (ref 70–99)
Potassium: 3.6 mmol/L (ref 3.5–5.1)
Sodium: 136 mmol/L (ref 135–145)

## 2022-12-04 LAB — TROPONIN I (HIGH SENSITIVITY): Troponin I (High Sensitivity): 4 ng/L (ref <18)

## 2022-12-04 LAB — BRAIN NATRIURETIC PEPTIDE: B Natriuretic Peptide: 35.4 pg/mL (ref 0.0–100.0)

## 2022-12-04 LAB — RESP PANEL BY RT-PCR (RSV, FLU A&B, COVID)  RVPGX2
Influenza A by PCR: NEGATIVE
Influenza B by PCR: NEGATIVE
Resp Syncytial Virus by PCR: NEGATIVE
SARS Coronavirus 2 by RT PCR: NEGATIVE

## 2022-12-04 MED ORDER — BUDESONIDE-FORMOTEROL FUMARATE 160-4.5 MCG/ACT IN AERO
2.0000 | INHALATION_SPRAY | Freq: Two times a day (BID) | RESPIRATORY_TRACT | 0 refills | Status: DC
  Filled 2022-12-04: qty 10.2, 30d supply, fill #0

## 2022-12-04 NOTE — ED Notes (Signed)
Pt eloped from ED lobby.

## 2022-12-04 NOTE — ED Provider Triage Note (Signed)
Emergency Medicine Provider Triage Evaluation Note  Jerry Bradley , a 72 y.o. male  was evaluated in triage.  Pt complains of chest pain, shortness of breath, productive cough that started at 0530 this morning following waking up.  He reports that he ran out of his inhaler and blood pressure medications.  When he went to go to the pharmacy, he was told that he needed a provider to refill them for him.  He denies fevers.  PMHx asthma. No home O2  Review of Systems  Positive: Chest pain, shortness of breath, productive cough Negative: Fever  Physical Exam  BP (!) 143/110   Pulse 71   Temp 98.1 F (36.7 C)   Resp 18   SpO2 95%  Gen:   Awake, no distress   Resp:  Normal effort.  Lung sounds without wheezing.  Speaking in full and complete sentences MSK:   Moves extremities without difficulty  Other:    Medical Decision Making  Medically screening exam initiated at 1:38 PM.  Appropriate orders placed.  Jerry Bradley was informed that the remainder of the evaluation will be completed by another provider, this initial triage assessment does not replace that evaluation, and the importance of remaining in the ED until their evaluation is complete.  Labs and cxr ordered   Judithann Sheen, [place] 12/04/22 1339

## 2022-12-04 NOTE — ED Triage Notes (Addendum)
Pt bib ems from home; pt is spanish speaking;  c/o sob since this am; pt out of inhaler and bp meds x 3 days; ems reports clear lung sounds bilaterally; 91% RA, pt placed on 2L O2, 100%; pt denies pain; 170/90, HR 74; NAD in triage

## 2022-12-07 ENCOUNTER — Other Ambulatory Visit: Payer: Self-pay

## 2022-12-08 ENCOUNTER — Other Ambulatory Visit: Payer: Self-pay

## 2022-12-09 ENCOUNTER — Other Ambulatory Visit: Payer: Self-pay

## 2022-12-23 ENCOUNTER — Other Ambulatory Visit: Payer: Self-pay

## 2022-12-23 ENCOUNTER — Encounter: Payer: Self-pay | Admitting: Family Medicine

## 2022-12-23 ENCOUNTER — Ambulatory Visit: Payer: Self-pay | Attending: Family Medicine | Admitting: Family Medicine

## 2022-12-23 VITALS — BP 119/68 | HR 62 | Ht 67.0 in | Wt 144.8 lb

## 2022-12-23 DIAGNOSIS — I10 Essential (primary) hypertension: Secondary | ICD-10-CM

## 2022-12-23 DIAGNOSIS — J4540 Moderate persistent asthma, uncomplicated: Secondary | ICD-10-CM

## 2022-12-23 DIAGNOSIS — Z23 Encounter for immunization: Secondary | ICD-10-CM

## 2022-12-23 DIAGNOSIS — G4709 Other insomnia: Secondary | ICD-10-CM

## 2022-12-23 DIAGNOSIS — R059 Cough, unspecified: Secondary | ICD-10-CM

## 2022-12-23 MED ORDER — ALBUTEROL SULFATE HFA 108 (90 BASE) MCG/ACT IN AERS
INHALATION_SPRAY | RESPIRATORY_TRACT | 3 refills | Status: DC
  Filled 2022-12-23: qty 6.7, 17d supply, fill #0
  Filled 2023-01-26: qty 6.7, 17d supply, fill #1
  Filled 2023-02-23 (×2): qty 6.7, 17d supply, fill #2
  Filled 2023-03-26: qty 6.7, 17d supply, fill #3

## 2022-12-23 MED ORDER — FLUTICASONE FUROATE-VILANTEROL 200-25 MCG/ACT IN AEPB
1.0000 | INHALATION_SPRAY | Freq: Every day | RESPIRATORY_TRACT | 3 refills | Status: DC
  Filled 2022-12-23: qty 60, 30d supply, fill #0
  Filled 2022-12-23: qty 60, 60d supply, fill #0
  Filled 2023-01-22: qty 60, 30d supply, fill #1
  Filled 2023-02-23 (×3): qty 60, 30d supply, fill #2
  Filled 2023-03-26: qty 60, 30d supply, fill #3

## 2022-12-23 MED ORDER — CETIRIZINE HCL 10 MG PO TABS
10.0000 mg | ORAL_TABLET | Freq: Every day | ORAL | 1 refills | Status: DC
  Filled 2022-12-23: qty 30, 30d supply, fill #0
  Filled 2023-01-26 – 2023-02-23 (×2): qty 30, 30d supply, fill #1

## 2022-12-23 MED ORDER — TRAZODONE HCL 50 MG PO TABS
50.0000 mg | ORAL_TABLET | Freq: Every evening | ORAL | 3 refills | Status: DC | PRN
  Filled 2022-12-23: qty 30, 30d supply, fill #0

## 2022-12-23 NOTE — Patient Instructions (Signed)
Insomnio Insomnia El insomnio es un trastorno del sueo que causa dificultades para conciliar el sueo o para [place]. Puede producir fatiga, falta de energa, dificultad para concentrarse, cambios en el estado de nimo y mal rendimiento escolar o laboral. Hay tres formas diferentes de clasificar el insomnio: Dificultad para conciliar el sueo. Dificultad para mantener el sueo. Despertar muy precoz por la maana. Cualquier tipo de insomnio puede ser a Air cabin crew (crnico) o a Product manager (agudo). Ambos son frecuentes. Generalmente, el insomnio a corto plazo dura 3 meses o menos tiempo. El insomnio crnico ocurre al menos tres veces por semana durante ms de 3 meses. Cules son las causas? El insomnio puede deberse a otra afeccin, situacin o sustancia, por ejemplo: Tener ciertas afecciones de salud mental, como ansiedad y depresin. Consumir cafena, alcohol, tabaco o drogas. Tener afecciones gastrointestinales, como enfermedad de reflujo gastroesofgico (ERGE). Tener ciertas afecciones. Estas incluyen las siguientes: Asma. Enfermedad de Alzheimer. Accidente cerebrovascular. Dolor crnico. Hiperactividad de la glndula tiroidea (hipertiroidismo). Otros trastornos del sueo, como sndrome de las piernas inquietas y apnea del sueo. Menopausia. En ocasiones, la causa del insomnio puede ser desconocida. Qu incrementa el riesgo? Los factores de riesgo de tener insomnio incluyen lo siguiente: Sexo. Esta afeccin es ms frecuente en las mujeres que en los hombres. Edad. El insomnio es ms frecuente a medida que las personas envejecen. El estrs y ciertas afecciones de salud mental y mdicas. Falta de actividad fsica. Tener un horario de trabajo irregular. Esto puede incluir trabajar turnos nocturnos y Tourist information centre manager entre diferentes zonas horarias. Cules son los signos o sntomas? Si tiene insomnio, el sntoma principal es la dificultad para conciliar el sueo o mantenerlo. Esto puede  derivar en otros sntomas, por ejemplo: Sentirse cansado o tener poca energa. Ponerse nervioso por [employer] irse a dormir. No sentirse descansado por la maana. Tener dificultad para concentrarse. Sentirse irritable, ansioso o deprimido. Cmo se diagnostica? Esta afeccin se puede diagnosticar en funcin de lo siguiente: Los sntomas y antecedentes mdicos. El mdico puede hacerle preguntas sobre: Hbitos de sueo. Cualquier afeccin mdica que tenga. La salud mental. Un examen fsico. Cmo se trata? El tratamiento para el insomnio depende de la causa. El tratamiento puede centrarse en tratar una afeccin subyacente que causa el insomnio. El tratamiento tambin puede incluir lo siguiente: Medicamentos que lo ayuden a dormir. Asesoramiento psicolgico o terapia. Ajustes en el estilo de vida para ayudarlo a dormir mejor. Siga estas indicaciones en su casa: Comida y bebida  Limite o evite el consumo de alcohol, bebidas con cafena y productos que contengan nicotina y tabaco, especialmente cerca de la hora de [place]. Estos productos pueden perturbarle el sueo. No consuma una comida suculenta ni coma alimentos condimentados justo antes de la hora de [place]. Esto puede causarle molestias digestivas y dificultades para dormir. Hbitos de sueo  Lleve un registro del sueo ya que podra ser de utilidad para que usted y a su mdico puedan determinar qu podra estar causndole insomnio. Escriba los siguientes datos: Cundo duerme. Cundo se despierta durante la noche. Qu tan bien duerme y si se siente descansado al da siguiente. Cualquier efecto secundario de los medicamentos que toma. Lo que usted come y bebe. Convierta su habitacin en un lugar oscuro, cmodo donde sea fcil conciliar el sueo. Coloque persianas o cortinas oscuras que impidan la entrada de la luz del exterior. Para bloquear los ruidos, use un aparato que reproduzca sonidos ambientales o relajantes de  fondo. Mantenga baja la temperatura. Limite el uso de  pantallas antes de la hora de [place]. Esto incluye: No ver televisin. No usar el telfono inteligente, la tableta o la computadora. Siga una rutina que incluya ir a dormir y Chiropodist a la misma hora cada da y noche. Esto puede ayudarlo a conciliar el sueo ms rpidamente. Considere realizar [employer] tranquila, como leer, e incorporarla como parte de la rutina a la hora de irse a dormir. Trate de evitar tomar siestas durante el da para que pueda dormir mejor por la noche. Levntese de la cama si sigue despierto despus de 15 minutos de haber intentado dormirse. Mantenga bajas las luces, pero intente leer o hacer una actividad tranquila. Cuando tenga sueo, regrese a Pharmacist, hospital. Indicaciones generales Use los medicamentos de venta libre y los recetados solamente como se lo haya indicado el mdico. Haga actividad fsica habitualmente como se lo haya indicado el mdico. Sin embargo, evite hacer actividad fsica las horas antes de [place]. Utilice tcnicas de relajacin para controlar el estrs. Pdale al mdico que le sugiera algunas tcnicas que sean adecuadas para usted. Pueden incluir: Ejercicios de respiracin. Rutinas para aliviar la tensin muscular. Visualizacin de escenas apacibles. Conduzca con cuidado. No conduzca si se siente muy somnoliento. Concurra a todas las visitas de seguimiento. Esto es importante. Comunquese con un mdico si: Est cansado durante todo Medical laboratory scientific officer. Tiene dificultad en su rutina diaria debido a la somnolencia. Sigue teniendo problemas para dormir o Teacher, English as a foreign language. Solicite ayuda de inmediato si: Piensa en lastimarse a usted mismo o a Engineer, maintenance (IT). Busque ayuda de inmediato si alguna vez siente que puede hacerse dao a usted mismo o a otros, o tiene pensamientos de Patent examiner a su vida. Dirjase al centro de urgencias ms cercano o: Llame al 911. Llame a National Suicide Prevention Lifeline (Lnea  Telefnica Nacional para la Prevencin del Suicidio) al 252-586-1133 o al 988. Est disponible las 24 horas del da. Enve un mensaje de texto a la lnea para casos de crisis al [phone]. Resumen El insomnio es un trastorno del sueo que causa dificultades para conciliar el sueo o para [place]. El insomnio puede ser a Air cabin crew (crnico) o a Product manager (agudo). El tratamiento para el insomnio depende de la causa. El tratamiento puede centrarse en tratar una afeccin subyacente que causa el insomnio. Lleve un registro del sueo ya que podra ser de utilidad para que usted y a su mdico puedan determinar qu podra estar causndole insomnio. Esta informacin no tiene Theme park manager el consejo del mdico. Asegrese de hacerle al mdico cualquier pregunta que tenga. Document Revised: 01/23/2021 Document Reviewed: 01/23/2021 Elsevier Patient Education  2024 [employer].

## 2022-12-23 NOTE — Progress Notes (Signed)
Subjective:  Patient ID: Jerry Bradley, male    DOB: 1950/05/11  Age: 72 y.o. MRN: [id number]  CC: Medical Management of Chronic Issues (Trouble sleeping/coughing)   HPI Jerry Bradley is a 72 y.o. year old male patient of Dr. Delford Field with a history of asthma, right AKA, hypertension.  Interval History: Discussed the use of AI scribe software for clinical note transcription with the patient, who gave verbal consent to proceed.   He presents with difficulty sleeping and a persistent cough. He reports having difficulty falling asleep for the past six months, which he initially managed with Valium brought from [place] by his daughter. However, since running out of Valium, he has been struggling to fall asleep. He denies daytime napping and only consumes one cup of coffee in the morning.  In addition to sleep issues, the patient has been experiencing a persistent cough for the past three weeks, which has been disturbing his sleep. He describes a lot of nasal dripping and needing to blow his nose several times a day. He denies postnasal drip or mucus going down his throat. He also reports a slight wheezing but denies shortness of breath or chest pain. He has run out of all his inhalers, including Breo and Symbicort, which he used to manage his asthma.        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  Social History   Socioeconomic History   Marital status: Single    Spouse name: Not on file   Number of children: Not on file   Years of education: Not on file   Highest education level: Not on file  Occupational History   Not on file  Tobacco Use   Smoking status: Former    Current packs/day: 0.00    Average packs/day: 1 pack/day for 45.0  years (45.0 ttl pk-yrs)    Types: Cigarettes    Start date: 11/13/1975    Quit date: 11/12/2020    Years since quitting: 2.1   Smokeless tobacco: Never  Vaping Use   Vaping status: Never Used  Substance and Sexual Activity   Alcohol use: Not Currently    Alcohol/week: 14.0 standard drinks of alcohol    Types: 14 Cans of beer per week   Drug use: Not Currently    Types: Cocaine    Comment: one time use , 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 90 tablet 2   valsartan (DIOVAN) 80 MG tablet Take 1 tablet (80 mg total) by mouth daily. 60 tablet 4   albuterol (VENTOLIN HFA) 108 (90 Base) MCG/ACT inhaler Inhale 2 inhalaciones hacia los pulmones cada 4 (quatro)  horas segn sea necesario para las sibilancias o la dificultad para respirar. 6.7 g 0   budesonide-formoterol (SYMBICORT) 160-4.5 MCG/ACT inhaler Inhale 2 puffs into the lungs 2 (two) times daily. 10.2 g 0   fluticasone furoate-vilanterol (BREO ELLIPTA) 200-25 MCG/ACT AEPB Inhale 1 puff into the lungs daily. 60 each 2   No facility-administered medications prior to visit.     ROS Review of Systems  Constitutional:  Negative for activity change and appetite change.  HENT:  Positive for rhinorrhea. Negative for sinus pressure and sore throat.   Respiratory:  Positive for cough. Negative for chest tightness, shortness of breath and wheezing.   Cardiovascular:  Negative for chest pain and palpitations.   Gastrointestinal:  Negative for abdominal distention, abdominal pain and constipation.  Genitourinary: Negative.   Musculoskeletal: Negative.   Psychiatric/Behavioral:  Positive for sleep disturbance. Negative for behavioral problems and dysphoric mood.     Objective:  BP 119/68   Pulse 62   Ht 5\' 7"  (1.702 m)   Wt 144 lb 12.8 oz (65.7 kg)   SpO2 97%   BMI 22.68 kg/m      12/23/2022   10:40 AM 12/04/2022    1:09 PM 01/06/2022   11:34 AM  BP/Weight  Systolic BP 119 143 116  Diastolic BP 68 110 64  Wt. (Lbs) 144.8    BMI 22.68 kg/m2        Physical Exam Constitutional:      Appearance: He is well-developed.  Cardiovascular:     Rate and Rhythm: Normal rate.     Heart sounds: Normal heart sounds. No murmur heard. Pulmonary:     Effort: Pulmonary effort is normal.     Breath sounds: Normal breath sounds. No wheezing or rales.  Chest:     Chest wall: No tenderness.  Abdominal:     General: Bowel sounds are normal. There is no distension.     Palpations: Abdomen is soft. There is no mass.     Tenderness: There is no abdominal tenderness.  Musculoskeletal:        General: Normal range of motion.     Left lower leg: No edema.     Comments: Right prosthesis  Neurological:     Mental Status: He is alert and oriented to person, place, and time.  Psychiatric:        Mood and Affect: Mood normal.        Latest Ref Rng & Units 12/04/2022    1:47 PM 11/15/2021    4:56 PM 10/03/2021    2:25 AM  CMP  Glucose 70 - 99 mg/dL 93  96  784   BUN 8 - 23 mg/dL 12  14  15    Creatinine 0.61 - 1.24 mg/dL 6.96  2.95  2.84   Sodium 135 - 145 mmol/L 136  138  140   Potassium 3.5 - 5.1 mmol/L 3.6  3.9  3.9   Chloride 98 - 111 mmol/L 107  107  110   CO2 22 - 32 mmol/L 18  25  25    Calcium 8.9 - 10.3 mg/dL 9.1  9.1  8.9   Total Protein 6.5 - 8.1 g/dL  8.3    Total Bilirubin 0.3 - 1.2 mg/dL  0.5    Alkaline Phos 38 - 126 U/L  63    AST 15 - 41 U/L  37    ALT 0 - 44 U/L  32       Lipid Panel  Component Value Date/Time   CHOL 136 07/02/2021 1117   TRIG 76 07/02/2021 1117   HDL 47 07/02/2021 1117   CHOLHDL 2.9 07/02/2021 1117   LDLCALC 74 07/02/2021 1117    CBC    Component Value Date/Time   WBC 8.0 12/04/2022 1347   RBC 5.04 12/04/2022 1347   HGB 12.4 (L) 12/04/2022 1347   HGB 11.4 (L) 07/02/2021 1117   HCT 39.8 12/04/2022 1347   HCT 34.5 (L) 07/02/2021 1117   PLT 375 12/04/2022 1347   PLT 551 (H) 07/02/2021 1117   MCV 79.0 (L) 12/04/2022 1347   MCV 85 07/02/2021 1117   MCH 24.6 (L) 12/04/2022 1347   MCHC 31.2 12/04/2022 1347   RDW 17.1 (H) 12/04/2022 1347   RDW 12.7 07/02/2021 1117   LYMPHSABS 1.1 12/04/2022 1347   LYMPHSABS 1.5 07/02/2021 1117   MONOABS 0.6 12/04/2022 1347   EOSABS 0.4 12/04/2022 1347   EOSABS 0.8 (H) 07/02/2021 1117   BASOSABS 0.1 12/04/2022 1347   BASOSABS 0.2 07/02/2021 1117    Lab Results  Component Value Date   HGBA1C 5.7 (H) 06/06/2021    Assessment & Plan:      Insomnia Difficulty falling asleep for the past six months, previously managed with Valium brought from [place]. No daytime napping or late-day caffeine intake. -Prescribe Trazodone for sleep as it is less habit forming  Chronic Asthma Patient ran out of all inhalers including Breo and Symbicort. No current shortness of breath or wheezing. -This could explain his cough -Refill Breo for daily use and Albuterol for emergency use.  Chronic Cough New onset, similar to previous episode associated with pneumonia. No current shortness of breath or wheezing. Reports nasal dripping and frequent need to blow nose. -Prescribe Zyrtec for sinus drainage to help with cough and runny nose.  Hypertension Controlled on current medication. -Continue current antihypertensive medication.  General Health Maintenance -Administer influenza vaccine today.          Meds ordered this encounter  Medications   traZODone (DESYREL) 50 MG tablet    Sig: Take 1  tablet (50 mg total) by mouth at bedtime as needed for sleep.    Dispense:  30 tablet    Refill:  3   cetirizine (ZYRTEC) 10 MG tablet    Sig: Take 1 tablet (10 mg total) by mouth daily.    Dispense:  30 tablet    Refill:  1   albuterol (VENTOLIN HFA) 108 (90 Base) MCG/ACT inhaler    Sig: Inhale 2 inhalaciones hacia los pulmones cada 4 (quatro) horas segn sea necesario para las sibilancias o la dificultad para respirar.    Dispense:  6.7 g    Refill:  3   fluticasone furoate-vilanterol (BREO ELLIPTA) 200-25 MCG/ACT AEPB    Sig: Inhale 1 puff into the lungs daily.    Dispense:  60 each    Refill:  3    Discontinue Symbicort    Follow-up: Return in about 3 months (around 03/25/2023) for Chronic medical conditions.       Hoy Register, MD, FAAFP. [facility] [place], [place] [phone]   12/23/2022, 12:14 PM

## 2023-01-11 ENCOUNTER — Other Ambulatory Visit: Payer: Self-pay

## 2023-01-22 ENCOUNTER — Other Ambulatory Visit: Payer: Self-pay

## 2023-01-26 ENCOUNTER — Other Ambulatory Visit: Payer: Self-pay | Admitting: Critical Care Medicine

## 2023-01-26 ENCOUNTER — Other Ambulatory Visit: Payer: Self-pay

## 2023-01-26 DIAGNOSIS — I10 Essential (primary) hypertension: Secondary | ICD-10-CM

## 2023-01-26 MED ORDER — AMLODIPINE BESYLATE 10 MG PO TABS
10.0000 mg | ORAL_TABLET | Freq: Every day | ORAL | 0 refills | Status: DC
  Filled 2023-01-26 – 2023-02-23 (×2): qty 90, 90d supply, fill #0

## 2023-01-26 MED ORDER — VALSARTAN 80 MG PO TABS
80.0000 mg | ORAL_TABLET | Freq: Every day | ORAL | 0 refills | Status: DC
Start: 2023-01-26 — End: 2023-04-07
  Filled 2023-01-26 – 2023-02-23 (×2): qty 90, 90d supply, fill #0

## 2023-02-04 ENCOUNTER — Other Ambulatory Visit: Payer: Self-pay

## 2023-02-23 ENCOUNTER — Other Ambulatory Visit: Payer: Self-pay

## 2023-03-25 ENCOUNTER — Ambulatory Visit: Payer: Self-pay | Admitting: Family Medicine

## 2023-03-26 ENCOUNTER — Other Ambulatory Visit: Payer: Self-pay

## 2023-03-26 ENCOUNTER — Other Ambulatory Visit: Payer: Self-pay | Admitting: Family Medicine

## 2023-03-26 MED ORDER — CETIRIZINE HCL 10 MG PO TABS
10.0000 mg | ORAL_TABLET | Freq: Every day | ORAL | 0 refills | Status: DC
  Filled 2023-03-26: qty 30, 30d supply, fill #0

## 2023-04-06 ENCOUNTER — Other Ambulatory Visit: Payer: Self-pay

## 2023-04-07 ENCOUNTER — Other Ambulatory Visit: Payer: Self-pay

## 2023-04-07 ENCOUNTER — Encounter: Payer: Self-pay | Admitting: Physician Assistant

## 2023-04-07 ENCOUNTER — Ambulatory Visit: Payer: Self-pay | Attending: Physician Assistant | Admitting: Physician Assistant

## 2023-04-07 VITALS — BP 137/69 | Temp 98.5°F | Ht 67.0 in | Wt 144.0 lb

## 2023-04-07 DIAGNOSIS — H04123 Dry eye syndrome of bilateral lacrimal glands: Secondary | ICD-10-CM

## 2023-04-07 DIAGNOSIS — H538 Other visual disturbances: Secondary | ICD-10-CM

## 2023-04-07 DIAGNOSIS — I10 Essential (primary) hypertension: Secondary | ICD-10-CM

## 2023-04-07 DIAGNOSIS — G4709 Other insomnia: Secondary | ICD-10-CM

## 2023-04-07 DIAGNOSIS — J4540 Moderate persistent asthma, uncomplicated: Secondary | ICD-10-CM

## 2023-04-07 MED ORDER — AMLODIPINE BESYLATE 10 MG PO TABS
10.0000 mg | ORAL_TABLET | Freq: Every day | ORAL | 0 refills | Status: DC
  Filled 2023-04-07 – 2023-07-29 (×2): qty 90, 90d supply, fill #0

## 2023-04-07 MED ORDER — FLUTICASONE FUROATE-VILANTEROL 200-25 MCG/ACT IN AEPB
1.0000 | INHALATION_SPRAY | Freq: Every day | RESPIRATORY_TRACT | 3 refills | Status: DC
  Filled 2023-04-07 – 2023-04-30 (×2): qty 60, 60d supply, fill #0
  Filled 2023-05-21: qty 60, 30d supply, fill #1
  Filled 2023-06-18: qty 60, 30d supply, fill #2
  Filled 2023-07-29: qty 60, 30d supply, fill #3

## 2023-04-07 MED ORDER — VALSARTAN 80 MG PO TABS
80.0000 mg | ORAL_TABLET | Freq: Every day | ORAL | 1 refills | Status: DC
  Filled 2023-04-07 – 2023-07-29 (×2): qty 90, 90d supply, fill #0

## 2023-04-07 MED ORDER — CETIRIZINE HCL 10 MG PO TABS
10.0000 mg | ORAL_TABLET | Freq: Every day | ORAL | 1 refills | Status: AC
  Filled 2023-04-07: qty 90, 90d supply, fill #0
  Filled 2023-08-23: qty 90, 90d supply, fill #1

## 2023-04-07 MED ORDER — TRAZODONE HCL 50 MG PO TABS
50.0000 mg | ORAL_TABLET | Freq: Every evening | ORAL | 3 refills | Status: AC | PRN
  Filled 2023-04-07: qty 30, 30d supply, fill #0
  Filled 2023-07-29: qty 30, 30d supply, fill #1

## 2023-04-07 MED ORDER — ALBUTEROL SULFATE HFA 108 (90 BASE) MCG/ACT IN AERS
INHALATION_SPRAY | RESPIRATORY_TRACT | 3 refills | Status: DC
  Filled 2023-04-07: qty 6.7, 17d supply, fill #0
  Filled 2023-05-21: qty 6.7, 17d supply, fill #1
  Filled 2023-06-18: qty 6.7, 17d supply, fill #2
  Filled 2023-07-29: qty 6.7, 17d supply, fill #3

## 2023-04-07 NOTE — Progress Notes (Signed)
 Patient ID: Jerry Bradley, male   DOB: 1950-07-19, 74 y.o.   MRN: [id number]   Jerry Bradley, is a 73 y.o. male  WUJ:[id number]  [id number]  DOB - 20-Oct-1950  Chief Complaint  Patient presents with   Medication Refill       Subjective:   Jerry Bradley is a 73 y.o. male here today for med RF and for ophthalmology referral.  He is starting to notice his allergies are acting up and needs RF.  He denies HA/CP/appetite changes or other s/sx.    No problems updated.  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 Authorizing Provider  albuterol (VENTOLIN HFA) 108 (90 Base) MCG/ACT inhaler Inhale 2 inhalaciones hacia los pulmones cada 4 (quatro) horas segn sea necesario para las sibilancias o la dificultad para respirar. 04/07/23   Anders Simmonds, PA-C  amLODipine (NORVASC) 10 MG tablet Take 1 tablet (10 mg total) by mouth daily. 04/07/23   Anders Simmonds, PA-C  cetirizine (ZYRTEC) 10 MG tablet Take 1 tablet (10 mg total) by mouth daily. 04/07/23   Anders Simmonds, PA-C  fluticasone furoate-vilanterol (BREO ELLIPTA) 200-25 MCG/ACT AEPB Inhale 1 puff into the lungs daily. 04/07/23   Anders Simmonds, PA-C  traZODone (DESYREL) 50 MG tablet Take 1 tablet (50 mg total) by mouth at bedtime as needed for sleep. 04/07/23   Anders Simmonds, PA-C  valsartan (DIOVAN) 80 MG tablet Take 1 tablet (80 mg total) by mouth daily. 04/07/23   Landy Mace, Marzella Schlein, PA-C    ROS: Neg HEENT Neg resp Neg cardiac Neg GI Neg GU Neg MS Neg psych Neg neuro  Objective:   Vitals:   04/07/23 1039  BP: 137/69  Temp: 98.5 F (36.9 C)  TempSrc: Oral  Weight: 144 lb (65.3 kg)  Height: 5\' 7"  (1.702 m)   Exam General  appearance : Awake, alert, not in any distress. Speech Clear. Not toxic looking HEENT: Atraumatic and Normocephalic Neck: Supple, no JVD. No cervical lymphadenopathy.  Chest: Good air entry bilaterally, CTAB.  No rales/rhonchi/wheezing CVS: S1 S2 regular, no murmurs.  Extremities: B/L Lower Ext shows no edema, both legs are warm to touch Neurology: Awake alert, and oriented X 3, CN II-XII intact, Non focal Skin: No Rash  Data Review Lab Results  Component Value Date   HGBA1C 5.7 (H) 06/06/2021   HGBA1C 5.6 12/09/2020    Assessment & Plan   1. Primary hypertension continue - valsartan (DIOVAN) 80 MG tablet; Take 1 tablet (80 mg total) by mouth daily.  Dispense: 90 tablet; Refill: 1 - amLODipine (NORVASC) 10 MG tablet; Take 1 tablet (10 mg total) by mouth daily.  Dispense: 90 tablet; Refill: 0  2. Moderate persistent asthma without complication - cetirizine (ZYRTEC) 10 MG tablet; Take 1 tablet (10 mg total) by mouth daily.  Dispense: 90 tablet; Refill: 1 - fluticasone furoate-vilanterol (BREO ELLIPTA) 200-25 MCG/ACT AEPB; Inhale 1 puff into the lungs daily.  Dispense: 60 each; Refill: 3 - albuterol (VENTOLIN HFA) 108 (90 Base) MCG/ACT inhaler; Inhale 2 inhalaciones hacia los pulmones cada 4 (quatro) horas segn sea necesario para las sibilancias o la dificultad para respirar.  Dispense: 6.7 g; Refill: 3  3. Other insomnia (Primary) -  traZODone (DESYREL) 50 MG tablet; Take 1 tablet (50 mg total) by mouth at bedtime as needed for sleep.  Dispense: 30 tablet; Refill: 3  4. Dry eyes - Ambulatory referral to Ophthalmology  5. Blurry vision - Ambulatory referral to Ophthalmology  6-AMN "Dennise" interpreters used and additional time performing visit was required.   Return in about 4 months (around 08/07/2023) for please assign him to one of our PCP here.  The patient was given clear instructions to go to ER or return to medical center if symptoms don't improve, worsen or new problems  develop. The patient verbalized understanding. The patient was told to call to get lab results if they haven't heard anything in the next week.      Georgian Co, PA-C [facility] [place], [place] [phone]   04/07/2023, 2:12 PM

## 2023-04-12 ENCOUNTER — Other Ambulatory Visit: Payer: Self-pay

## 2023-04-16 ENCOUNTER — Other Ambulatory Visit: Payer: Self-pay

## 2023-04-30 ENCOUNTER — Other Ambulatory Visit: Payer: Self-pay | Admitting: Family Medicine

## 2023-04-30 ENCOUNTER — Other Ambulatory Visit: Payer: Self-pay

## 2023-04-30 DIAGNOSIS — J4540 Moderate persistent asthma, uncomplicated: Secondary | ICD-10-CM

## 2023-05-06 ENCOUNTER — Other Ambulatory Visit: Payer: Self-pay

## 2023-05-21 ENCOUNTER — Other Ambulatory Visit: Payer: Self-pay

## 2023-06-18 ENCOUNTER — Other Ambulatory Visit: Payer: Self-pay
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
## 2023-07-19 ENCOUNTER — Other Ambulatory Visit: Payer: Self-pay

## 2023-07-29 ENCOUNTER — Other Ambulatory Visit: Payer: Self-pay

## 2023-08-09 ENCOUNTER — Ambulatory Visit: Payer: Self-pay | Admitting: Family Medicine

## 2023-08-23 ENCOUNTER — Other Ambulatory Visit: Payer: Self-pay | Admitting: Physician Assistant

## 2023-08-23 ENCOUNTER — Other Ambulatory Visit: Payer: Self-pay

## 2023-08-23 ENCOUNTER — Telehealth: Payer: Self-pay | Admitting: Family Medicine

## 2023-08-23 DIAGNOSIS — J4540 Moderate persistent asthma, uncomplicated: Secondary | ICD-10-CM

## 2023-08-23 MED ORDER — FLUTICASONE FUROATE-VILANTEROL 200-25 MCG/ACT IN AEPB
1.0000 | INHALATION_SPRAY | Freq: Every day | RESPIRATORY_TRACT | 3 refills | Status: DC
  Filled 2023-08-23: qty 60, 30d supply, fill #0

## 2023-08-23 MED ORDER — ALBUTEROL SULFATE HFA 108 (90 BASE) MCG/ACT IN AERS
INHALATION_SPRAY | RESPIRATORY_TRACT | 0 refills | Status: DC
  Filled 2023-08-23: qty 6.7, 17d supply, fill #0
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
## 2023-08-24 ENCOUNTER — Other Ambulatory Visit: Payer: Self-pay

## 2023-08-24 NOTE — Telephone Encounter (Signed)
Medication was filled yesterday

## 2023-08-24 NOTE — Telephone Encounter (Signed)
 Called patient unable to make contact, voicemail has been left    Interpreter id # [phone]

## 2023-08-25 ENCOUNTER — Telehealth: Payer: Self-pay | Admitting: Family Medicine

## 2023-08-25 NOTE — Telephone Encounter (Signed)
 Called patient, no answer. Left voicemail confirming upcoming appointment on 08/26/2023 9:10 am. Provided callback number for any questions or changes.

## 2023-08-26 ENCOUNTER — Other Ambulatory Visit: Payer: Self-pay

## 2023-08-26 ENCOUNTER — Ambulatory Visit: Payer: Self-pay | Attending: Family Medicine | Admitting: Family Medicine

## 2023-08-26 ENCOUNTER — Encounter: Payer: Self-pay | Admitting: Family Medicine

## 2023-08-26 VITALS — BP 112/63 | HR 59 | Ht 67.0 in | Wt 143.4 lb

## 2023-08-26 DIAGNOSIS — F17210 Nicotine dependence, cigarettes, uncomplicated: Secondary | ICD-10-CM

## 2023-08-26 DIAGNOSIS — I10 Essential (primary) hypertension: Secondary | ICD-10-CM

## 2023-08-26 DIAGNOSIS — Z131 Encounter for screening for diabetes mellitus: Secondary | ICD-10-CM

## 2023-08-26 DIAGNOSIS — Z1211 Encounter for screening for malignant neoplasm of colon: Secondary | ICD-10-CM

## 2023-08-26 DIAGNOSIS — J4540 Moderate persistent asthma, uncomplicated: Secondary | ICD-10-CM

## 2023-08-26 MED ORDER — AMLODIPINE BESYLATE 10 MG PO TABS
10.0000 mg | ORAL_TABLET | Freq: Every day | ORAL | 0 refills | Status: AC
  Filled 2023-08-26: qty 90, 90d supply, fill #0

## 2023-08-26 MED ORDER — VALSARTAN 80 MG PO TABS
80.0000 mg | ORAL_TABLET | Freq: Every day | ORAL | 1 refills | Status: AC
  Filled 2023-08-26: qty 90, 90d supply, fill #0

## 2023-08-26 MED ORDER — ALBUTEROL SULFATE HFA 108 (90 BASE) MCG/ACT IN AERS
INHALATION_SPRAY | RESPIRATORY_TRACT | 0 refills | Status: AC
  Filled 2023-08-26: qty 6.7, fill #0

## 2023-08-26 MED ORDER — FLUTICASONE FUROATE-VILANTEROL 200-25 MCG/ACT IN AEPB
1.0000 | INHALATION_SPRAY | Freq: Every day | RESPIRATORY_TRACT | 3 refills | Status: AC
  Filled 2023-08-26: qty 60, 60d supply, fill #0
  Filled 2023-09-22: qty 60, 30d supply, fill #0

## 2023-08-26 NOTE — Patient Instructions (Signed)
 Cmo lidiar con el desafo de dejar de fumar Managing the Challenge of Quitting Smoking Dejar de fumar constituye un desafo fsico y mental. Gretchen Short deba enfrentarse al deseo intenso, a los sntomas de abstinencia y a la tentacin de fumar. Antes de dejar de fumar, trabaje con el mdico para elaborar un plan que pueda ayudarle a sobrellevar la situacin. Hacer un plan antes de dejar de fumar puede ayudarlo a que no recaiga en el hbito cuando tenga la necesidad imperiosa de fumar mientras intenta dejarlo. Cmo manejar los cambios en el estilo de vida Control del estrs El estrs puede provocarle ganas de fumar y querer fumar puede causarle estrs. Es importante que busque maneras de Charity fundraiser. Puede probar [employer] de las siguientes: Utilice tcnicas de relajacin. Respire lenta y profundamente, inhalando por la Darene Lamer y exhalando por la boca. Escuche msica. Tome un bao de inmersin o [place]. Imagine un lugar apacible o unas vacaciones. Obtenga apoyo. Hable con un familiar o un amigo sobre el estrs. nase a un grupo de apoyo. Hable con un consejero o terapeuta. Haga algo de Tabor fsica. Salga a caminar, a correr o a Lobbyist. Juegue su deporte favorito. Practique yoga.  Medicamentos Consulte a su mdico [employer] que podran ayudarle a lidiar con el deseo intenso de fumar y a Radio producer ms fcil abandonar el hbito. Las [employer] situaciones sociales pueden ser difciles cuando est intentando dejar de fumar. Para manejarlas, puede hacer lo siguiente: Evite las fiestas y otras situaciones sociales en las que las personas podran fumar. Evite tomar alcohol. Retrese de inmediato del lugar si siente necesidad de fumar. Explqueles a sus familiares y amigos que est dejando de fumar. Pida apoyo y hgales saber que podra estar algo malhumorado. Planifique actividades en las que no se pueda fumar. Instrucciones generales Tenga en cuenta que muchas  personas aumentan de peso despus de dejar de fumar. Sin embargo, no les sucede a todos. Para no aumentar de peso, implemente un plan antes de dejar de fumar y resptelo despus de dejar el hbito. El plan debe incluir lo siguiente: Tomar colaciones saludables. Cuando tenga un deseo intenso, quizs lo [employer] lo siguiente: Comer palomitas de maz, o quizs zanahorias, apio u otras verduras cortadas. Mastique goma de mascar sin azcar. Cambie la [employer] de comer. Srvase porciones pequeas en las comidas. Haga entre 4 y 6 comidas pequeas a lo largo del Geophysical data processor de 1 o 2 comidas abundantes por Futures trader. Sea consciente al comer. Evite mirar televisin o hacer otras cosas que puedan distraerlo mientras come. Hacer actividad fsica con regularidad. Hgase tiempo para la actividad fsica [employer]. Si no tiene tiempo para un entrenamiento prolongado, haga breves tandas de ejercicio durante 5 a 10 minutos varias veces por da. Realice alguna forma de ejercicios de fortalecimiento, como levantar pesas. Haga algn ejercicio que haga trabajar al corazn y que le haga respirar profundamente, como caminar rpido, correr, nadar o andar en bicicleta. Esto es [employer]. Beba abundante cantidad de agua u otras bebidas de bajas caloras o sin caloras. Beber suficiente lquido como para Pharmacologist la orina de color amarillo plido.  Cmo reconocer los sntomas de abstinencia El cuerpo y la mente pueden sentir malestar cuando usted trata de acostumbrarse a la falta de nicotina en el organismo. Estos efectos se llaman sntomas de abstinencia. Pueden incluir los siguientes: Ms apetito de lo normal. Tener dificultad para concentrarse. Sentirse irritable o inquieto. Problemas para dormir. Sensacin de depresin.  Deseo intenso de fumar un cigarrillo. Estos sntomas pueden sorprenderle, pero son normales al dejar de fumar. Para manejar los sntomas de abstinencia: Evite los lugares, las personas y las  actividades que desencadenen el deseo intenso. Recuerde por qu quiere dejar de fumar. Duerma lo suficiente. Evite el caf y otras bebidas que contengan cafena. Estos pueden empeorar algunos de los sntomas. Cmo controlar el deseo intenso [employer] un plan para controlar el deseo intenso de fumar. El plan debera incluir lo siguiente: Una definicin de la situacin especfica que quiere controlar. Una actividad o accin que har para reemplazar el tabaquismo. Una idea clara de cmo le ayudar esa medida. El [place] de alguien que podra brindarle ayuda. El deseo intenso suele durar entre 5 y 10 minutos. Considere adoptar las siguientes medidas como ayuda para planificar cmo lidiar con el deseo intenso: Mantenga la boca ocupada. Mastique goma de mascar sin azcar. Chupe caramelos duros o un sorbete. Cepllese los dientes. Mantenga las manos y el cuerpo ocupados. Cambie de [place] de inmediato. Apriete o juegue con [employer]. Realice alguna actividad o algn pasatiempo, por ejemplo, haga alhajas con cuentas, practique bordado sobre caamazo o trabaje con Louise. Modifique su rutina habitual. Tome un breve descanso para hacer ejercicio. Haga una caminata rpida o suba y baje las escaleras corriendo. Concntrese en hacer algo bueno o til para otra persona. Llame a algn amigo o familiar para hablar durante el deseo intenso. nase a un grupo de apoyo. Pngase en contacto con una lnea telefnica para dejar de fumar. Dnde buscar apoyo Para obtener ayuda o encontrar un grupo de apoyo: Llame a la lnea para dejar de fumar del [employer] ([employer] del [place]): 1-800-QUIT-NOW (971) 109-2992) Leda Roys a SmokefreeTXT: 829562 Dnde obtener ms informacin Visite estos sitios web para obtener ms informacin sobre cmo dejar de fumar: U.S. Department of Health and [address] (Departamento de Salud y [place] Humanos de los [place]): www.smokefree.gov American  Lung Association (Asociacin Estadounidense del Pulmn): www.freedomfromsmoking.org Centers for Disease Control and Prevention Insurance claims handler) (Centros para el Control y la Prevencin de Event organiser): FootballExhibition.com.br American Heart Association (Asociacin Estadounidense del Corazn): www.heart.org Comunquese con un mdico si: Desea cambiar su plan para dejar de fumar. Los medicamentos que toma no son eficaces. Siente que no puede controlar su alimentacin o no puede dormir. Se siente deprimido o se pone muy ansioso. Resumen Dejar de fumar constituye un desafo fsico y mental. Deber enfrentarse al deseo intenso, a los sntomas de abstinencia y a la tentacin de volver a fumar. La preparacin puede ayudarlo a medida que enfrenta estos desafos. Pruebe diferentes tcnicas para Charity fundraiser, Company secretary las [address] y prevenir el aumento de [place]. Puede lidiar con el deseo intenso manteniendo la boca ocupada (por ejemplo, masticando goma de Theatre manager); [employer] cuerpo y las manos ocupados, llamando a familiares o amigos, o ponindose en contacto con un lnea telefnica de ayuda para personas que quieren dejar de fumar. Puede lidiar con los sntomas de abstinencia evitando los lugares donde la gente fuma, descansando mucho y evitando las bebidas que contengan cafena. Esta informacin no tiene Theme park manager el consejo del mdico. Asegrese de hacerle al mdico cualquier pregunta que tenga. Document Revised: 01/23/2021 Document Reviewed: 01/23/2021 Elsevier Patient Education  2024 [employer].

## 2023-08-26 NOTE — Progress Notes (Signed)
 Subjective:  Patient ID: Jerry Bradley, male    DOB: 23-Jul-1950  Age: 73 y.o. MRN: [id number]  CC: Medical Management of Chronic Issues     Discussed the use of AI scribe software for clinical note transcription with the patient, who gave verbal consent to proceed.  History of Present Illness Jerry Bradley is a 73 year old male with asthma and hypertension, nicotine dependence (greater than 45-pack-year history), right AKA who presents for follow-up.  His asthma is well-controlled with inhalers, with no recent flare-ups or dyspnea. He adheres to his blood pressure medications, amlodipine  and valsartan , daily. He smokes two cigarettes a day, sometimes none, but previously smoked half a pack per day for forty-five years.    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  Social History   Socioeconomic History   Marital status: Single    Spouse name: Not on file   Number of children: Not on file   Years of education: Not on file   Highest education level: Not on file  Occupational History   Not on file  Tobacco Use   Smoking status: Former    Current packs/day: 0.00    Average packs/day: 1 pack/day for 45.0 years (45.0 ttl pk-yrs)    Types: Cigarettes    Start date: 11/13/1975    Quit date: 11/12/2020    Years since quitting: 2.7   Smokeless tobacco: Never  Vaping Use   Vaping status: Never Used  Substance and Sexual Activity   Alcohol use: Not Currently    Alcohol/week: 14.0 standard drinks of alcohol    Types: 14 Cans of beer per week   Drug use: Not Currently    Types: Cocaine    Comment: one time use , 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 90 tablet 1   albuterol  (VENTOLIN  HFA) 108 (90 Base) MCG/ACT inhaler Inhale 2 inhalaciones hacia los pulmones cada 4 (quatro) horas segn sea necesario para las sibilancias o la dificultad para respirar. 6.7 g 3   albuterol  (VENTOLIN  HFA) 108 (90 Base) MCG/ACT inhaler Inhale 2 puffs into the lungs every 4 hours as needed for shortness of breath. 6.7 g 0   amLODipine  (NORVASC ) 10 MG tablet Take 1 tablet (10 mg total) by mouth daily. 90 tablet 0   fluticasone  furoate-vilanterol (BREO ELLIPTA ) 200-25 MCG/ACT AEPB Inhale 1 puff into the lungs daily. 60 each 3   valsartan  (DIOVAN ) 80 MG tablet Take 1 tablet (80 mg total) by mouth daily. 90 tablet 1   traZODone  (DESYREL ) 50 MG tablet Take 1 tablet (50 mg total) by mouth at bedtime as needed for sleep. (Patient  not taking: Reported on 08/26/2023) 30 tablet 3   No facility-administered medications prior to visit.     ROS Review of Systems  Constitutional:  Negative for activity change and appetite change.  HENT:  Negative for sinus pressure and sore throat.   Respiratory:  Negative for chest tightness, shortness of breath and wheezing.   Cardiovascular:  Negative for chest pain and palpitations.  Gastrointestinal:  Negative for abdominal distention, abdominal pain and constipation.  Genitourinary: Negative.   Musculoskeletal: Negative.   Psychiatric/Behavioral:  Negative for behavioral problems and dysphoric mood.     Objective:  BP 112/63   Pulse (!) 59   Ht 5' 7 (1.702 m)   Wt 143 lb 6.4 oz (65 kg)    SpO2 96%   BMI 22.46 kg/m      08/26/2023    9:13 AM 04/07/2023   10:39 AM 12/23/2022   10:40 AM  BP/Weight  Systolic BP 112 137 119  Diastolic BP 63 69 68  Wt. (Lbs) 143.4 144 144.8  BMI 22.46 kg/m2 22.55 kg/m2 22.68 kg/m2      Physical Exam Constitutional:      Appearance: He is well-developed.  Cardiovascular:     Rate and Rhythm: Bradycardia present.     Heart sounds: Normal heart sounds. No murmur heard. Pulmonary:     Effort: Pulmonary effort is normal.     Breath sounds: Normal breath sounds. No wheezing or rales.  Chest:     Chest wall: No tenderness.  Abdominal:     General: Bowel sounds are normal. There is no distension.     Palpations: Abdomen is soft. There is no mass.     Tenderness: There is no abdominal tenderness.  Musculoskeletal:        General: Normal range of motion.     Right lower leg: No edema.     Left lower leg: No edema.     Comments: Right AKA  Neurological:     Mental Status: He is alert and oriented to person, place, and time.  Psychiatric:        Mood and Affect: Mood normal.        Latest Ref Rng & Units 12/04/2022    1:47 PM 11/15/2021    4:56 PM 10/03/2021    2:25 AM  CMP  Glucose 70 - 99 mg/dL 93  96  886   BUN 8 - 23 mg/dL 12  14  15    Creatinine 0.61 - 1.24 mg/dL 9.19  9.08  8.86   Sodium 135 - 145 mmol/L 136  138  140   Potassium 3.5 - 5.1 mmol/L 3.6  3.9  3.9   Chloride 98 - 111 mmol/L 107  107  110   CO2 22 - 32 mmol/L 18  25  25    Calcium 8.9 - 10.3 mg/dL 9.1  9.1  8.9   Total Protein 6.5 - 8.1 g/dL  8.3    Total Bilirubin 0.3 - 1.2 mg/dL  0.5    Alkaline Phos 38 - 126 U/L  63    AST 15 - 41 U/L  37    ALT 0 - 44 U/L  32      Lipid Panel     Component Value Date/Time   CHOL 136 07/02/2021 1117   TRIG 76 07/02/2021 1117   HDL 47 07/02/2021 1117   CHOLHDL 2.9 07/02/2021 1117   LDLCALC 74 07/02/2021 1117    CBC    Component Value Date/Time  WBC 8.0 12/04/2022 1347   RBC 5.04 12/04/2022 1347   HGB 12.4  (L) 12/04/2022 1347   HGB 11.4 (L) 07/02/2021 1117   HCT 39.8 12/04/2022 1347   HCT 34.5 (L) 07/02/2021 1117   PLT 375 12/04/2022 1347   PLT 551 (H) 07/02/2021 1117   MCV 79.0 (L) 12/04/2022 1347   MCV 85 07/02/2021 1117   MCH 24.6 (L) 12/04/2022 1347   MCHC 31.2 12/04/2022 1347   RDW 17.1 (H) 12/04/2022 1347   RDW 12.7 07/02/2021 1117   LYMPHSABS 1.1 12/04/2022 1347   LYMPHSABS 1.5 07/02/2021 1117   MONOABS 0.6 12/04/2022 1347   EOSABS 0.4 12/04/2022 1347   EOSABS 0.8 (H) 07/02/2021 1117   BASOSABS 0.1 12/04/2022 1347   BASOSABS 0.2 07/02/2021 1117    Lab Results  Component Value Date   HGBA1C 5.7 (H) 06/06/2021       Assessment & Plan Chronic asthma Asthma well-controlled with current inhaler regimen. - Continue Albuterol  and Fluticasone  furoate-vilanterol.  Hypertension Hypertension well-controlled with current medication. - Continue Amlodipine  10 mg oral daily. - Continue Valsartan  80 mg oral daily.  Smoking greater than 20-pack-year Long-term tobacco use, advised cessation due to lung cancer risk. - Advise smoking cessation. - Order CT scan for lung cancer screening.   Screening for colon cancer/screening for diabetes mellitus - Fecal occult blood immunochemical test ordered - A1c ordered  Meds ordered this encounter  Medications   albuterol  (VENTOLIN  HFA) 108 (90 Base) MCG/ACT inhaler    Sig: Inhale 2 puffs into the lungs every 4 hours as needed for shortness of breath.    Dispense:  6.7 g    Refill:  0   amLODipine  (NORVASC ) 10 MG tablet    Sig: Take 1 tablet (10 mg total) by mouth daily.    Dispense:  90 tablet    Refill:  0   fluticasone  furoate-vilanterol (BREO ELLIPTA ) 200-25 MCG/ACT AEPB    Sig: Inhale 1 puff into the lungs daily.    Dispense:  60 each    Refill:  3    Discontinue Symbicort    valsartan  (DIOVAN ) 80 MG tablet    Sig: Take 1 tablet (80 mg total) by mouth daily.    Dispense:  90 tablet    Refill:  1    Follow-up: Return in  about 6 months (around 02/26/2024) for Chronic medical conditions.       Corrina Sabin, MD, FAAFP. [facility] [place], [place] [phone]   08/26/2023, 10:19 AM

## 2023-08-27 ENCOUNTER — Ambulatory Visit: Payer: Self-pay | Admitting: Family Medicine

## 2023-08-27 LAB — CMP14+EGFR
ALT: 26 IU/L (ref 0–44)
AST: 31 IU/L (ref 0–40)
Albumin: 4.1 g/dL (ref 3.9–4.9)
Alkaline Phosphatase: 64 IU/L (ref 44–121)
BUN/Creatinine Ratio: 22 (ref 10–24)
BUN: 19 mg/dL (ref 8–27)
Bilirubin Total: 0.3 mg/dL (ref 0.0–1.2)
CO2: 21 mmol/L (ref 20–29)
Calcium: 9 mg/dL (ref 8.6–10.2)
Chloride: 104 mmol/L (ref 96–106)
Creatinine, Ser: 0.87 mg/dL (ref 0.76–1.27)
Globulin, Total: 3.1 g/dL (ref 1.5–4.5)
Glucose: 85 mg/dL (ref 70–99)
Potassium: 4.3 mmol/L (ref 3.5–5.2)
Sodium: 139 mmol/L (ref 134–144)
Total Protein: 7.2 g/dL (ref 6.0–8.5)
eGFR: 93 mL/min/1.73 (ref >59)

## 2023-08-27 LAB — CBC WITH DIFFERENTIAL/PLATELET
Basophils Absolute: 0.1 x10E3/uL (ref 0.0–0.2)
Basos: 1 %
EOS (ABSOLUTE): 0.4 x10E3/uL (ref 0.0–0.4)
Eos: 5 %
Hematocrit: 38.5 % (ref 37.5–51.0)
Hemoglobin: 12.7 g/dL — ABNORMAL LOW (ref 13.0–17.7)
Immature Grans (Abs): 0 x10E3/uL (ref 0.0–0.1)
Immature Granulocytes: 0 %
Lymphocytes Absolute: 1.6 x10E3/uL (ref 0.7–3.1)
Lymphs: 20 %
MCH: 27.7 pg (ref 26.6–33.0)
MCHC: 33 g/dL (ref 31.5–35.7)
MCV: 84 fL (ref 79–97)
Monocytes Absolute: 0.8 x10E3/uL (ref 0.1–0.9)
Monocytes: 11 %
Neutrophils Absolute: 4.8 x10E3/uL (ref 1.4–7.0)
Neutrophils: 62 %
Platelets: 401 x10E3/uL (ref 150–450)
RBC: 4.59 x10E6/uL (ref 4.14–5.80)
RDW: 13.9 % (ref 11.6–15.4)
WBC: 7.8 x10E3/uL (ref 3.4–10.8)

## 2023-08-27 LAB — LP+NON-HDL CHOLESTEROL
Cholesterol, Total: 139 mg/dL (ref 100–199)
HDL: 52 mg/dL (ref >39)
LDL Chol Calc (NIH): 75 mg/dL (ref 0–99)
Total Non-HDL-Chol (LDL+VLDL): 87 mg/dL (ref 0–129)
Triglycerides: 59 mg/dL (ref 0–149)
VLDL Cholesterol Cal: 12 mg/dL (ref 5–40)

## 2023-08-27 LAB — HEMOGLOBIN A1C
Est. average glucose Bld gHb Est-mCnc: 108 mg/dL
Hgb A1c MFr Bld: 5.4 % (ref 4.8–5.6)

## 2023-09-22 ENCOUNTER — Other Ambulatory Visit: Payer: Self-pay

## 2023-10-01 ENCOUNTER — Emergency Department (HOSPITAL_COMMUNITY)
Admission: EM | Admit: 2023-10-01 | Discharge: 2023-10-02 | Disposition: A | Discharge status: Home / Self Care | Payer: Self-pay | Attending: Emergency Medicine | Admitting: Emergency Medicine

## 2023-10-01 ENCOUNTER — Emergency Department (HOSPITAL_COMMUNITY): Payer: Self-pay

## 2023-10-01 DIAGNOSIS — R6889 Other general symptoms and signs: Secondary | ICD-10-CM | POA: Insufficient documentation

## 2023-10-01 DIAGNOSIS — I10 Essential (primary) hypertension: Secondary | ICD-10-CM | POA: Insufficient documentation

## 2023-10-01 DIAGNOSIS — Z7951 Long term (current) use of inhaled steroids: Secondary | ICD-10-CM | POA: Insufficient documentation

## 2023-10-01 DIAGNOSIS — Z79899 Other long term (current) drug therapy: Secondary | ICD-10-CM | POA: Insufficient documentation

## 2023-10-01 DIAGNOSIS — R911 Solitary pulmonary nodule: Secondary | ICD-10-CM | POA: Insufficient documentation

## 2023-10-01 DIAGNOSIS — J45909 Unspecified asthma, uncomplicated: Secondary | ICD-10-CM | POA: Insufficient documentation

## 2023-10-01 LAB — PROTIME-INR
INR: 1 (ref 0.8–1.2)
Prothrombin Time: 13.5 s (ref 11.4–15.2)

## 2023-10-01 MED ORDER — ALBUTEROL SULFATE HFA 108 (90 BASE) MCG/ACT IN AERS
2.0000 | INHALATION_SPRAY | Freq: Once | RESPIRATORY_TRACT | Status: AC
  Administered 2023-10-01: 2 via RESPIRATORY_TRACT
  Filled 2023-10-01: qty 6.7

## 2023-10-01 NOTE — ED Triage Notes (Signed)
 Pt arrives in custody of [employer] with c/o sob, onset a few hours ago. NAD noted during triage, breathing e/u

## 2023-10-01 NOTE — ED Provider Triage Note (Signed)
 Emergency Medicine Provider Triage Evaluation Note  Jerry Bradley , a 73 y.o. male  was evaluated in triage.  Pt complains of shortness of breath for 3 hours and feeling like he needs his inhaler.  Review of Systems  Positive: Wheezing, shortness of breath Negative: Chest pain  Physical Exam  BP 138/78 (BP Location: Right Arm)   Pulse 65   Temp 97.9 F (36.6 C) (Oral)   Resp 16   SpO2 95%  Gen:   Awake, no distress   Resp:  Normal effort, no appreciable wheezing MSK:   Moves extremities without difficulty  Medical Decision Making  Medically screening exam initiated at 11:13 PM.  Appropriate orders placed.  Shelly Kieran Shields was informed that the remainder of the evaluation will be completed by another provider, this initial triage assessment does not replace that evaluation, and the importance of remaining in the ED until their evaluation is complete.    Rogelia Jerilynn RAMAN, MD 10/01/23 [phone]

## 2023-10-02 ENCOUNTER — Emergency Department (HOSPITAL_COMMUNITY): Payer: Self-pay

## 2023-10-02 LAB — CBC WITH DIFFERENTIAL/PLATELET
Abs Immature Granulocytes: 0.05 K/uL (ref 0.00–0.07)
Basophils Absolute: 0.2 K/uL — ABNORMAL HIGH (ref 0.0–0.1)
Basophils Relative: 2 %
Eosinophils Absolute: 0.4 K/uL (ref 0.0–0.5)
Eosinophils Relative: 4 %
HCT: 36.7 % — ABNORMAL LOW (ref 39.0–52.0)
Hemoglobin: 11.9 g/dL — ABNORMAL LOW (ref 13.0–17.0)
Immature Granulocytes: 1 %
Lymphocytes Relative: 16 %
Lymphs Abs: 1.6 K/uL (ref 0.7–4.0)
MCH: 27.1 pg (ref 26.0–34.0)
MCHC: 32.4 g/dL (ref 30.0–36.0)
MCV: 83.6 fL (ref 80.0–100.0)
Monocytes Absolute: 0.9 K/uL (ref 0.1–1.0)
Monocytes Relative: 8 %
Neutro Abs: 7.2 K/uL (ref 1.7–7.7)
Neutrophils Relative %: 69 %
Platelets: 393 K/uL (ref 150–400)
RBC: 4.39 MIL/uL (ref 4.22–5.81)
RDW: 15 % (ref 11.5–15.5)
WBC: 10.3 K/uL (ref 4.0–10.5)
nRBC: 0 % (ref 0.0–0.2)

## 2023-10-02 LAB — COMPREHENSIVE METABOLIC PANEL WITH GFR
ALT: 25 U/L (ref 0–44)
AST: 48 U/L — ABNORMAL HIGH (ref 15–41)
Albumin: 3.6 g/dL (ref 3.5–5.0)
Alkaline Phosphatase: 56 U/L (ref 38–126)
Anion gap: 14 (ref 5–15)
BUN: 13 mg/dL (ref 8–23)
CO2: 19 mmol/L — ABNORMAL LOW (ref 22–32)
Calcium: 8.9 mg/dL (ref 8.9–10.3)
Chloride: 107 mmol/L (ref 98–111)
Creatinine, Ser: 0.82 mg/dL (ref 0.61–1.24)
GFR, Estimated: >60 mL/min (ref >60)
Glucose, Bld: 88 mg/dL (ref 70–99)
Potassium: 4.1 mmol/L (ref 3.5–5.1)
Sodium: 140 mmol/L (ref 135–145)
Total Bilirubin: 0.8 mg/dL (ref 0.0–1.2)
Total Protein: 7.5 g/dL (ref 6.5–8.1)

## 2023-10-02 LAB — BRAIN NATRIURETIC PEPTIDE: B Natriuretic Peptide: 31.1 pg/mL (ref 0.0–100.0)

## 2023-10-02 LAB — TROPONIN I (HIGH SENSITIVITY)
Troponin I (High Sensitivity): 5 ng/L (ref <18)
Troponin I (High Sensitivity): 5 ng/L (ref <18)

## 2023-10-02 MED ORDER — DEXAMETHASONE SODIUM PHOSPHATE 10 MG/ML IJ SOLN
10.0000 mg | Freq: Once | INTRAMUSCULAR | Status: AC
  Administered 2023-10-02: 10 mg via INTRAMUSCULAR
  Filled 2023-10-02: qty 1

## 2023-10-02 NOTE — ED Provider Notes (Signed)
  EMERGENCY DEPARTMENT AT [facility] Provider Note   CSN: [id number] Arrival date & time: 10/01/23  2253     Patient presents with: No chief complaint on file.   Jerry Bradley is a 73 y.o. male.   The history is provided by the patient.  Wheezing Severity:  Mild Severity compared to prior episodes:  Less severe Progression:  Unchanged Chronicity:  Recurrent Context: not tartrazine   Relieved by:  Nothing Worsened by:  Nothing Ineffective treatments:  None tried Associated symptoms: no chest pain, no chest tightness, no cough, no fever, no sore throat, no sputum production and no stridor   Risk factors: not exposed to toxic fumes   Patient with asthma who feels like it is flaring on him.  No f/c/r.  No CP.    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 Authorizing Provider  albuterol  (VENTOLIN  HFA) 108 (90 Base) MCG/ACT inhaler Inhale 2 puffs into the lungs every 4 hours as needed for shortness of breath. 08/26/23   Delbert Clam, MD  amLODipine  (NORVASC ) 10 MG tablet Take 1 tablet (10 mg total) by mouth daily. 08/26/23   Newlin, Enobong, MD  cetirizine  (ZYRTEC ) 10 MG tablet Take 1 tablet (10 mg total) by mouth daily. 04/07/23   Danton Jon HERO, PA-C  fluticasone  furoate-vilanterol (BREO ELLIPTA ) 200-25 MCG/ACT AEPB Inhale 1 puff into the lungs daily. 08/26/23   Newlin, Enobong, MD  traZODone  (DESYREL ) 50 MG tablet Take 1 tablet (50 mg total) by mouth at bedtime as needed for sleep. Patient not taking: Reported on 08/26/2023 04/07/23   Danton Jon HERO, PA-C  valsartan  (DIOVAN ) 80 MG tablet Take 1 tablet (80 mg total) by mouth daily. 08/26/23   Newlin, Enobong, MD    Allergies: Patient has no known allergies.    Review of Systems  Constitutional:  Negative  for fever.  HENT:  Negative for sore throat.   Respiratory:  Positive for wheezing. Negative for cough, sputum production, chest tightness and stridor.   Cardiovascular:  Negative for chest pain.  All other systems reviewed and are negative.   Updated Vital Signs BP (!) 146/77   Pulse 64   Temp 97.9 F (36.6 C) (Oral)   Resp 18   SpO2 100%   Physical Exam Vitals and nursing note reviewed.  Constitutional:      General: He is not in acute distress.    Appearance: He is well-developed. He is not diaphoretic.  HENT:     Head: Normocephalic and atraumatic.     Right Ear: External ear normal.     Left Ear: External ear normal.     Nose: Nose normal.  Eyes:     Conjunctiva/sclera: Conjunctivae normal.     Pupils: Pupils are equal, round, and reactive to light.  Cardiovascular:     Rate and Rhythm: Normal rate and regular rhythm.     Pulses: Normal pulses.     Heart sounds: Normal heart sounds.  Pulmonary:     Effort: Pulmonary effort is normal.     Breath sounds: Normal breath sounds. No wheezing or rales.  Abdominal:     General: Bowel sounds are normal.     Palpations: Abdomen is soft.     Tenderness: There is no abdominal  tenderness. There is no guarding or rebound.  Musculoskeletal:        General: Normal range of motion.     Cervical back: Normal range of motion and neck supple.  Skin:    General: Skin is warm and dry.     Capillary Refill: Capillary refill takes less than 2 seconds.  Neurological:     General: No focal deficit present.     Mental Status: He is alert and oriented to person, place, and time.     Deep Tendon Reflexes: Reflexes normal.     Comments: RLW BKA     (all labs ordered are listed, but only abnormal results are displayed) Results for orders placed or performed during the hospital encounter of 10/01/23  CBC with Differential   Collection Time: 10/01/23 11:17 PM  Result Value Ref Range   WBC 10.3 4.0 - 10.5 K/uL   RBC 4.39 4.22 - 5.81  MIL/uL   Hemoglobin 11.9 (L) 13.0 - 17.0 g/dL   HCT 63.2 (L) 60.9 - 47.9 %   MCV 83.6 80.0 - 100.0 fL   MCH 27.1 26.0 - 34.0 pg   MCHC 32.4 30.0 - 36.0 g/dL   RDW 84.9 88.4 - 84.4 %   Platelets 393 150 - 400 K/uL   nRBC 0.0 0.0 - 0.2 %   Neutrophils Relative % 69 %   Neutro Abs 7.2 1.7 - 7.7 K/uL   Lymphocytes Relative 16 %   Lymphs Abs 1.6 0.7 - 4.0 K/uL   Monocytes Relative 8 %   Monocytes Absolute 0.9 0.1 - 1.0 K/uL   Eosinophils Relative 4 %   Eosinophils Absolute 0.4 0.0 - 0.5 K/uL   Basophils Relative 2 %   Basophils Absolute 0.2 (H) 0.0 - 0.1 K/uL   Immature Granulocytes 1 %   Abs Immature Granulocytes 0.05 0.00 - 0.07 K/uL  Comprehensive metabolic panel   Collection Time: 10/01/23 11:17 PM  Result Value Ref Range   Sodium 140 135 - 145 mmol/L   Potassium 4.1 3.5 - 5.1 mmol/L   Chloride 107 98 - 111 mmol/L   CO2 19 (L) 22 - 32 mmol/L   Glucose, Bld 88 70 - 99 mg/dL   BUN 13 8 - 23 mg/dL   Creatinine, Ser 9.17 0.61 - 1.24 mg/dL   Calcium 8.9 8.9 - 89.6 mg/dL   Total Protein 7.5 6.5 - 8.1 g/dL   Albumin 3.6 3.5 - 5.0 g/dL   AST 48 (H) 15 - 41 U/L   ALT 25 0 - 44 U/L   Alkaline Phosphatase 56 38 - 126 U/L   Total Bilirubin 0.8 0.0 - 1.2 mg/dL   GFR, Estimated >39 >39 mL/min   Anion gap 14 5 - 15  Brain natriuretic peptide   Collection Time: 10/01/23 11:17 PM  Result Value Ref Range   B Natriuretic Peptide 31.1 0.0 - 100.0 pg/mL  Protime-INR   Collection Time: 10/01/23 11:17 PM  Result Value Ref Range   Prothrombin Time 13.5 11.4 - 15.2 seconds   INR 1.0 0.8 - 1.2  Troponin I (High Sensitivity)   Collection Time: 10/01/23 11:17 PM  Result Value Ref Range   Troponin I (High Sensitivity) 5 <18 ng/L  Troponin I (High Sensitivity)   Collection Time: 10/02/23  2:00 AM  Result Value Ref Range   Troponin I (High Sensitivity) 5 <18 ng/L   DG Chest 2 View Result Date: 10/02/2023 EXAM: 2 VIEW(S) XRAY OF THE CHEST 10/02/2023 12:20:29 AM COMPARISON: None  available.  CLINICAL HISTORY: SOB. FINDINGS: LUNGS AND PLEURA: 6 mm nodule versus nipple shadow in the left lower lung. The lungs are otherwise clear. HEART AND MEDIASTINUM: Stable cardiomediastinal silhouette. BONES AND SOFT TISSUES: Aortic atherosclerotic calcification. IMPRESSION: 1. 6 mm nodule versus nipple shadow in the left lower lung. Repeat radiographs with nipple markers are recommended. 2. Aortic atherosclerotic calcification. Electronically signed by: Norman Gatlin MD 10/02/2023 12:30 AM EDT RP Workstation: [id number]     EKG: EKG Interpretation Date/Time:  Friday October 01 2023 23:06:43 EDT Ventricular Rate:  63 PR Interval:  180 QRS Duration:  86 QT Interval:  392 QTC Calculation: 401 R Axis:   3  Text Interpretation: Normal sinus rhythm Normal ECG When compared with ECG of 04-Dec-2022 13:14, No significant change was found Confirmed by Raford Lenis ([id number]) on 10/01/2023 11:55:53 PM  Radiology: [facility] Chest 2 View Result Date: 10/02/2023 EXAM: 2 VIEW(S) XRAY OF THE CHEST 10/02/2023 12:20:29 AM COMPARISON: None available. CLINICAL HISTORY: SOB. FINDINGS: LUNGS AND PLEURA: 6 mm nodule versus nipple shadow in the left lower lung. The lungs are otherwise clear. HEART AND MEDIASTINUM: Stable cardiomediastinal silhouette. BONES AND SOFT TISSUES: Aortic atherosclerotic calcification. IMPRESSION: 1. 6 mm nodule versus nipple shadow in the left lower lung. Repeat radiographs with nipple markers are recommended. 2. Aortic atherosclerotic calcification. Electronically signed by: Norman Gatlin MD 10/02/2023 12:30 AM EDT RP Workstation: [id number]     Procedures   Medications Ordered in the ED  albuterol  (VENTOLIN  HFA) 108 (90 Base) MCG/ACT inhaler 2 puff (2 puffs Inhalation Given 10/01/23 2322)  dexamethasone  (DECADRON ) injection 10 mg (10 mg Intramuscular Given 10/02/23 0356)                                    Medical Decision Making Feels he is having an asthma flair   Amount and/or Complexity  of Data Reviewed Independent Historian:     Details: Police see above  External Data Reviewed: notes.    Details: Previous notes reviewed  Labs: ordered.    Details: 2 negative troponins 5/5 normal sodium 140, normal potassium 4 normal white count 10.3, low hemoglobin 11.9 Radiology: ordered and independent interpretation performed.    Details: Negative  ECG/medicine tests: ordered and independent interpretation performed. Decision-making details documented in ED Course.  Risk Prescription drug management. Risk Details: No wheezing.  A dose of steroids was given.  Exam vitals labs and imaging are benign and and reassuring.  Stable for discharge with close follow up     Final diagnoses:  Pulmonary nodule  Multiple complaints   Return for intractable cough, coughing up blood, fevers > 100.4 unrelieved by medication, shortness of breath, intractable vomiting, chest pain, shortness of breath, weakness, numbness, changes in speech, facial asymmetry, abdominal pain, passing out, Inability to tolerate liquids or food, cough, altered mental status or any concerns. No signs of systemic illness or infection. The patient is nontoxic-appearing on exam and vital signs are within normal limits.  I have reviewed the triage vital signs and the nursing notes. Pertinent labs & imaging results that were available during my care of the patient were reviewed by me and considered in my medical decision making (see chart for details). After history, exam, and medical workup I feel the patient has been appropriately medically screened and is safe for discharge home. Pertinent diagnoses were discussed with the patient. Patient was given return precautions.  ED Discharge Orders  None          Briget Shaheed, MD 10/02/23 [id number]

## 2023-10-05 ENCOUNTER — Ambulatory Visit (HOSPITAL_COMMUNITY): Payer: Self-pay | Attending: Family Medicine

## 2023-12-20 ENCOUNTER — Other Ambulatory Visit: Payer: Self-pay
